# Patient Record
Sex: Female | Born: 1945
Health system: Southern US, Community
[De-identification: ages and names within clinical notes are randomized; demographics above are authoritative.]

## PROBLEM LIST (undated history)

## (undated) DIAGNOSIS — Z923 Personal history of irradiation: Secondary | ICD-10-CM

## (undated) DIAGNOSIS — E119 Type 2 diabetes mellitus without complications: Secondary | ICD-10-CM

## (undated) DIAGNOSIS — M549 Dorsalgia, unspecified: Secondary | ICD-10-CM

## (undated) DIAGNOSIS — C801 Malignant (primary) neoplasm, unspecified: Secondary | ICD-10-CM

## (undated) DIAGNOSIS — IMO0002 Reserved for concepts with insufficient information to code with codable children: Secondary | ICD-10-CM

## (undated) DIAGNOSIS — D649 Anemia, unspecified: Secondary | ICD-10-CM

## (undated) DIAGNOSIS — H269 Unspecified cataract: Secondary | ICD-10-CM

## (undated) DIAGNOSIS — T8859XA Other complications of anesthesia, initial encounter: Secondary | ICD-10-CM

## (undated) DIAGNOSIS — H409 Unspecified glaucoma: Secondary | ICD-10-CM

## (undated) DIAGNOSIS — G629 Polyneuropathy, unspecified: Secondary | ICD-10-CM

## (undated) DIAGNOSIS — Z9221 Personal history of antineoplastic chemotherapy: Secondary | ICD-10-CM

## (undated) DIAGNOSIS — M1990 Unspecified osteoarthritis, unspecified site: Secondary | ICD-10-CM

## (undated) DIAGNOSIS — I10 Essential (primary) hypertension: Secondary | ICD-10-CM

## (undated) DIAGNOSIS — H34213 Partial retinal artery occlusion, bilateral: Secondary | ICD-10-CM

## (undated) DIAGNOSIS — T4145XA Adverse effect of unspecified anesthetic, initial encounter: Secondary | ICD-10-CM

## (undated) HISTORY — PX: BREAST LUMPECTOMY: SHX2

## (undated) HISTORY — PX: EYE SURGERY: SHX253

## (undated) HISTORY — DX: Malignant (primary) neoplasm, unspecified: C80.1

## (undated) HISTORY — PX: BREAST CYST ASPIRATION: SHX578

## (undated) HISTORY — PX: COLONOSCOPY: SHX174

## (undated) HISTORY — DX: Unspecified osteoarthritis, unspecified site: M19.90

## (undated) HISTORY — PX: BREAST EXCISIONAL BIOPSY: SUR124

## (undated) HISTORY — DX: Polyneuropathy, unspecified: G62.9

## (undated) HISTORY — DX: Reserved for concepts with insufficient information to code with codable children: IMO0002

## (undated) HISTORY — DX: Type 2 diabetes mellitus without complications: E11.9

## (undated) HISTORY — PX: DILATION AND CURETTAGE OF UTERUS: SHX78

## (undated) HISTORY — PX: TOE SURGERY: SHX1073

## (undated) HISTORY — PX: BREAST BIOPSY: SHX20

---

## 2004-03-06 ENCOUNTER — Ambulatory Visit: Payer: Self-pay

## 2004-03-16 ENCOUNTER — Ambulatory Visit: Payer: Self-pay

## 2004-03-30 ENCOUNTER — Ambulatory Visit: Payer: Self-pay | Admitting: Unknown Physician Specialty

## 2007-02-18 ENCOUNTER — Ambulatory Visit: Payer: Self-pay | Admitting: Family Medicine

## 2008-04-02 DIAGNOSIS — G629 Polyneuropathy, unspecified: Secondary | ICD-10-CM

## 2008-04-02 DIAGNOSIS — C50919 Malignant neoplasm of unspecified site of unspecified female breast: Secondary | ICD-10-CM

## 2008-04-02 HISTORY — PX: BREAST SURGERY: SHX581

## 2008-04-02 HISTORY — DX: Polyneuropathy, unspecified: G62.9

## 2008-04-02 HISTORY — DX: Malignant neoplasm of unspecified site of unspecified female breast: C50.919

## 2008-04-02 HISTORY — PX: SENTINEL LYMPH NODE BIOPSY: SHX2392

## 2008-04-27 ENCOUNTER — Ambulatory Visit: Payer: Self-pay

## 2008-05-14 ENCOUNTER — Ambulatory Visit: Payer: Self-pay | Admitting: General Surgery

## 2008-05-21 ENCOUNTER — Ambulatory Visit: Payer: Self-pay | Admitting: Cardiology

## 2008-05-21 ENCOUNTER — Ambulatory Visit: Payer: Self-pay | Admitting: General Surgery

## 2008-05-26 ENCOUNTER — Ambulatory Visit: Payer: Self-pay | Admitting: General Surgery

## 2008-05-26 DIAGNOSIS — C50919 Malignant neoplasm of unspecified site of unspecified female breast: Secondary | ICD-10-CM | POA: Insufficient documentation

## 2008-05-26 DIAGNOSIS — C801 Malignant (primary) neoplasm, unspecified: Secondary | ICD-10-CM

## 2008-05-26 HISTORY — DX: Malignant (primary) neoplasm, unspecified: C80.1

## 2008-05-31 ENCOUNTER — Ambulatory Visit: Payer: Self-pay | Admitting: Internal Medicine

## 2008-06-04 ENCOUNTER — Ambulatory Visit: Payer: Self-pay | Admitting: Internal Medicine

## 2008-06-07 ENCOUNTER — Ambulatory Visit: Payer: Self-pay | Admitting: General Surgery

## 2008-07-01 ENCOUNTER — Ambulatory Visit: Payer: Self-pay | Admitting: Internal Medicine

## 2008-07-31 ENCOUNTER — Ambulatory Visit: Payer: Self-pay | Admitting: Internal Medicine

## 2008-08-31 ENCOUNTER — Ambulatory Visit: Payer: Self-pay | Admitting: Internal Medicine

## 2008-09-30 ENCOUNTER — Ambulatory Visit: Payer: Self-pay | Admitting: Internal Medicine

## 2008-10-31 ENCOUNTER — Ambulatory Visit: Payer: Self-pay | Admitting: Internal Medicine

## 2008-11-30 ENCOUNTER — Ambulatory Visit: Payer: Self-pay | Admitting: General Surgery

## 2008-12-01 ENCOUNTER — Ambulatory Visit: Payer: Self-pay | Admitting: Internal Medicine

## 2008-12-31 ENCOUNTER — Ambulatory Visit: Payer: Self-pay | Admitting: Internal Medicine

## 2009-01-31 ENCOUNTER — Ambulatory Visit: Payer: Self-pay | Admitting: Internal Medicine

## 2009-03-02 ENCOUNTER — Ambulatory Visit: Payer: Self-pay | Admitting: Family Medicine

## 2009-03-02 ENCOUNTER — Ambulatory Visit: Payer: Self-pay | Admitting: Internal Medicine

## 2009-03-03 ENCOUNTER — Ambulatory Visit: Payer: Self-pay | Admitting: Internal Medicine

## 2009-04-02 ENCOUNTER — Ambulatory Visit: Payer: Self-pay | Admitting: Internal Medicine

## 2009-05-03 ENCOUNTER — Ambulatory Visit: Payer: Self-pay | Admitting: Internal Medicine

## 2009-06-01 ENCOUNTER — Ambulatory Visit: Payer: Self-pay

## 2009-06-02 ENCOUNTER — Ambulatory Visit: Payer: Self-pay | Admitting: Internal Medicine

## 2009-07-01 ENCOUNTER — Ambulatory Visit: Payer: Self-pay | Admitting: Internal Medicine

## 2009-07-31 ENCOUNTER — Ambulatory Visit: Payer: Self-pay | Admitting: Internal Medicine

## 2009-08-31 ENCOUNTER — Ambulatory Visit: Payer: Self-pay | Admitting: Internal Medicine

## 2009-09-20 ENCOUNTER — Ambulatory Visit: Payer: Self-pay | Admitting: Internal Medicine

## 2009-09-30 ENCOUNTER — Ambulatory Visit: Payer: Self-pay | Admitting: Internal Medicine

## 2009-11-03 ENCOUNTER — Ambulatory Visit: Payer: Self-pay | Admitting: Internal Medicine

## 2009-12-01 ENCOUNTER — Ambulatory Visit: Payer: Self-pay | Admitting: Internal Medicine

## 2009-12-08 ENCOUNTER — Ambulatory Visit: Payer: Self-pay | Admitting: General Surgery

## 2009-12-13 LAB — CANCER ANTIGEN 27.29: CA 27.29: 7.8 U/mL (ref 0.0–38.6)

## 2009-12-31 ENCOUNTER — Ambulatory Visit: Payer: Self-pay | Admitting: Internal Medicine

## 2010-02-02 ENCOUNTER — Ambulatory Visit: Payer: Self-pay | Admitting: Internal Medicine

## 2010-03-02 ENCOUNTER — Ambulatory Visit: Payer: Self-pay | Admitting: Internal Medicine

## 2010-03-30 ENCOUNTER — Ambulatory Visit: Payer: Self-pay | Admitting: Internal Medicine

## 2010-03-31 LAB — CANCER ANTIGEN 27.29: CA 27.29: 11.1 U/mL (ref 0.0–38.6)

## 2010-04-02 ENCOUNTER — Ambulatory Visit: Payer: Self-pay | Admitting: Internal Medicine

## 2010-05-25 ENCOUNTER — Emergency Department: Payer: Self-pay | Admitting: Emergency Medicine

## 2010-06-06 ENCOUNTER — Ambulatory Visit: Payer: Self-pay

## 2010-08-04 ENCOUNTER — Ambulatory Visit: Payer: Self-pay | Admitting: Internal Medicine

## 2010-09-01 ENCOUNTER — Ambulatory Visit: Payer: Self-pay | Admitting: Internal Medicine

## 2010-09-01 LAB — CANCER ANTIGEN 27.29: CA 27.29: 11.6 U/mL (ref 0.0–38.6)

## 2010-12-19 ENCOUNTER — Ambulatory Visit: Payer: Self-pay

## 2011-03-01 ENCOUNTER — Ambulatory Visit: Payer: Self-pay | Admitting: Internal Medicine

## 2011-03-02 LAB — CANCER ANTIGEN 27.29: CA 27.29: 7.5 U/mL (ref 0.0–38.6)

## 2011-03-03 ENCOUNTER — Ambulatory Visit: Payer: Self-pay | Admitting: Internal Medicine

## 2011-06-19 ENCOUNTER — Ambulatory Visit: Payer: Self-pay

## 2011-10-01 ENCOUNTER — Ambulatory Visit: Payer: Self-pay | Admitting: Internal Medicine

## 2011-10-01 LAB — CBC CANCER CENTER
Basophil %: 0.6 %
Eosinophil %: 3 %
HCT: 36.9 % (ref 35.0–47.0)
Lymphocyte #: 3.2 x10 3/mm (ref 1.0–3.6)
MCH: 26.8 pg (ref 26.0–34.0)
MCHC: 31.1 g/dL — ABNORMAL LOW (ref 32.0–36.0)
MCV: 86 fL (ref 80–100)
Monocyte #: 0.8 x10 3/mm (ref 0.2–0.9)
Monocyte %: 8.2 %
Neutrophil #: 5.7 x10 3/mm (ref 1.4–6.5)
Neutrophil %: 56.8 %
RBC: 4.28 10*6/uL (ref 3.80–5.20)
WBC: 10.1 x10 3/mm (ref 3.6–11.0)

## 2011-10-01 LAB — COMPREHENSIVE METABOLIC PANEL
Anion Gap: 9 (ref 7–16)
Bilirubin,Total: 0.2 mg/dL (ref 0.2–1.0)
Chloride: 104 mmol/L (ref 98–107)
Co2: 27 mmol/L (ref 21–32)
EGFR (African American): >60
EGFR (Non-African Amer.): >60
SGOT(AST): 16 U/L (ref 15–37)
SGPT (ALT): 24 U/L

## 2011-10-01 LAB — IRON AND TIBC
Iron Bind.Cap.(Total): 354 ug/dL (ref 250–450)
Iron: 48 ug/dL — ABNORMAL LOW (ref 50–170)
Unbound Iron-Bind.Cap.: 306 ug/dL

## 2011-10-01 LAB — RETICULOCYTES: Absolute Retic Count: 0.0468 10*6/uL (ref 0.024–0.084)

## 2011-10-01 LAB — FERRITIN: Ferritin (ARMC): 39 ng/mL (ref 8–388)

## 2011-10-02 LAB — CANCER ANTIGEN 27.29: CA 27.29: 11.8 U/mL (ref 0.0–38.6)

## 2011-11-01 ENCOUNTER — Ambulatory Visit: Payer: Self-pay | Admitting: Internal Medicine

## 2011-12-24 ENCOUNTER — Ambulatory Visit: Payer: Self-pay | Admitting: Internal Medicine

## 2012-01-08 ENCOUNTER — Ambulatory Visit: Payer: Self-pay | Admitting: Family Medicine

## 2012-02-01 ENCOUNTER — Ambulatory Visit: Payer: Self-pay | Admitting: Family Medicine

## 2012-03-02 ENCOUNTER — Ambulatory Visit: Payer: Self-pay | Admitting: Family Medicine

## 2012-03-04 ENCOUNTER — Ambulatory Visit: Payer: Self-pay | Admitting: Family Medicine

## 2012-04-02 ENCOUNTER — Ambulatory Visit: Payer: Self-pay | Admitting: Family Medicine

## 2012-04-09 DIAGNOSIS — H269 Unspecified cataract: Secondary | ICD-10-CM | POA: Insufficient documentation

## 2012-04-15 DIAGNOSIS — H401190 Primary open-angle glaucoma, unspecified eye, stage unspecified: Secondary | ICD-10-CM | POA: Insufficient documentation

## 2012-05-31 ENCOUNTER — Encounter: Payer: Self-pay | Admitting: *Deleted

## 2012-06-19 ENCOUNTER — Ambulatory Visit: Payer: Self-pay | Admitting: General Surgery

## 2012-06-20 DIAGNOSIS — M549 Dorsalgia, unspecified: Secondary | ICD-10-CM | POA: Insufficient documentation

## 2012-07-03 ENCOUNTER — Ambulatory Visit: Payer: Self-pay | Admitting: General Surgery

## 2012-07-09 ENCOUNTER — Ambulatory Visit (INDEPENDENT_AMBULATORY_CARE_PROVIDER_SITE_OTHER): Payer: Medicare Other | Admitting: General Surgery

## 2012-07-09 ENCOUNTER — Encounter: Payer: Self-pay | Admitting: General Surgery

## 2012-07-09 VITALS — BP 118/62 | HR 76 | Resp 14 | Ht 62.0 in | Wt 256.0 lb

## 2012-07-09 DIAGNOSIS — C50919 Malignant neoplasm of unspecified site of unspecified female breast: Secondary | ICD-10-CM

## 2012-07-09 DIAGNOSIS — C50912 Malignant neoplasm of unspecified site of left female breast: Secondary | ICD-10-CM

## 2012-07-09 NOTE — Patient Instructions (Addendum)
The patient is aware to call back for any questions or concerns. Continue self breast exams.  The patient has been asked to return to the office in one year for a bilateral diagnostic mammogram.

## 2012-07-09 NOTE — Progress Notes (Signed)
Patient ID: Selena Contreras, female   DOB: 09/21/1945, 67 y.o.   MRN: [id number]  Chief Complaint  Patient presents with  . Breast Cancer Long Term Follow Up    HPI Selena Contreras is a 67 y.o. female.  Patient here today for follow up mammogram .  Patient with known history of left breast wide excision for breast cancer 05-26-08, completed chemotherapy and radiation  In 2010. Patient does state she has occasional left breast "soreness" but otherwise doing "good". Followed by Dr Sherrlyn Hock and Dr Rushie Chestnut at [facility]. HPI  Past Medical History  Diagnosis Date  . Cancer 40981     wide LEFT excision, mastoplasty and sentinel node biopsy in February 2010  . Ulcer     STOMACH  . Arthritis   . Diabetes mellitus without complication     AGE 77  . Neuropathy 2010    Past Surgical History  Procedure Laterality Date  . Breast surgery Left 2010    LEFT BREAST WIDE EXCISION  . Sentinel lymph node biopsy Left 2010  . Toe surgery    . Colonoscopy  2012    [place]    Family History  Problem Relation Age of Onset  . Breast cancer Sister   . Colon cancer      Social History History  Substance Use Topics  . Smoking status: Never Smoker   . Smokeless tobacco: Never Used  . Alcohol Use: No    Allergies  Allergen Reactions  . Flagyl (Metronidazole)   . Lisinopril Cough  . Naprosyn (Naproxen)     UNKNOWN   . Tegaderm Ag Mesh (Silver)     UNKNOWN   . Tape Rash    PAPER OK TO USE     Current Outpatient Prescriptions  Medication Sig Dispense Refill  . aspirin 81 MG tablet Take 81 mg by mouth daily.      . bimatoprost (LUMIGAN) 0.01 % SOLN 1 drop at bedtime.      . calcium carbonate (OS-CAL) 600 MG TABS Take 600 mg by mouth 2 (two) times daily with a meal.      . candesartan (ATACAND) 8 MG tablet Take 8 mg by mouth daily.      . Ferrous Sulfate (IRON) 325 (65 FE) MG TABS Take 1 tablet by mouth daily.      . fish oil-omega-3 fatty acids 1000 MG capsule Take 2 g by  mouth daily.      Marland Kitchen gabapentin (NEURONTIN) 300 MG capsule       . glipiZIDE (GLUCOTROL) 10 MG tablet Take 10 mg by mouth 2 (two) times daily before a meal.      . metFORMIN (GLUCOPHAGE) 1000 MG tablet Take 1,000 mg by mouth 2 (two) times daily with a meal.      . sitaGLIPtin (JANUVIA) 100 MG tablet Take 100 mg by mouth daily.      . timolol (BETIMOL) 0.25 % ophthalmic solution 1-2 drops 2 (two) times daily.       No current facility-administered medications for this visit.    Review of Systems Review of Systems  Constitutional: Negative.   Respiratory: Negative.   Cardiovascular: Negative.     Blood pressure 118/62, pulse 76, resp. rate 14, height 5\' 2"  (1.575 m), weight 256 lb (116.121 kg).  Physical Exam Physical Exam  Constitutional: She is oriented to person, place, and time. She appears well-developed and well-nourished.  Cardiovascular: Normal rate and regular rhythm.   Pulmonary/Chest: Effort normal and breath sounds  normal. Right breast exhibits no inverted nipple, no mass, no nipple discharge, no skin change and no tenderness. Left breast exhibits no inverted nipple, no mass, no nipple discharge, no skin change and no tenderness.  Lymphadenopathy:    She has no cervical adenopathy.    She has no axillary adenopathy.  Neurological: She is alert and oriented to person, place, and time.  Skin: Skin is warm and dry.  Left breast well healed incision at 3 oclock  Data Reviewed Bilateral mammograms dated 06/19/2012 showed the right breast to be unremarkable. Stable postsurgical changes left breast. BI-RADS-2.  Assessment    Doing well now 4 years post management of her triple negative breast cancer.    Plan    Followup examination with bilateral mammograms in 1 year per       Earline Mayotte 07/10/2012, 8:38 PM

## 2012-07-10 ENCOUNTER — Encounter: Payer: Self-pay | Admitting: General Surgery

## 2012-09-30 ENCOUNTER — Ambulatory Visit: Payer: Self-pay | Admitting: Internal Medicine

## 2012-10-01 LAB — CREATININE, SERUM
Creatinine: 0.99 mg/dL (ref 0.60–1.30)
EGFR (African American): >60
EGFR (Non-African Amer.): 60 — ABNORMAL LOW

## 2012-10-01 LAB — HEPATIC FUNCTION PANEL A (ARMC)
Albumin: 3.3 g/dL — ABNORMAL LOW (ref 3.4–5.0)
SGPT (ALT): 24 U/L (ref 12–78)

## 2012-10-01 LAB — IRON AND TIBC
Iron Bind.Cap.(Total): 349 ug/dL (ref 250–450)
Iron Saturation: 27 %
Iron: 93 ug/dL (ref 50–170)
Unbound Iron-Bind.Cap.: 256 ug/dL

## 2012-10-01 LAB — CBC CANCER CENTER
Basophil #: 0.1 x10 3/mm (ref 0.0–0.1)
Basophil %: 1 %
Eosinophil #: 0.4 x10 3/mm (ref 0.0–0.7)
Eosinophil %: 5.6 %
HCT: 35.8 % (ref 35.0–47.0)
HGB: 11.7 g/dL — ABNORMAL LOW (ref 12.0–16.0)
Lymphocyte %: 30 %
Lymphs Abs: 2.3 x10 3/mm (ref 1.0–3.6)
MCH: 27.6 pg (ref 26.0–34.0)
MCHC: 32.7 g/dL (ref 32.0–36.0)
MCV: 84 fL (ref 80–100)
Monocyte #: 0.7 x10 3/mm (ref 0.2–0.9)
Monocyte %: 9.4 %
Neutrophil #: 4.1 x10 3/mm (ref 1.4–6.5)
Neutrophil %: 54 %
Platelet: 235 x10 3/mm (ref 150–440)
RBC: 4.24 x10 6/mm (ref 3.80–5.20)
RDW: 14.2 % (ref 11.5–14.5)
WBC: 7.6 x10 3/mm (ref 3.6–11.0)

## 2012-10-02 LAB — CANCER ANTIGEN 27.29: CA 27.29: 9.3 U/mL (ref 0.0–38.6)

## 2012-10-31 ENCOUNTER — Ambulatory Visit: Payer: Self-pay | Admitting: Internal Medicine

## 2013-06-23 ENCOUNTER — Ambulatory Visit: Payer: Self-pay | Admitting: General Surgery

## 2013-06-24 ENCOUNTER — Encounter: Payer: Self-pay | Admitting: General Surgery

## 2013-07-06 ENCOUNTER — Ambulatory Visit: Payer: Medicare Other | Admitting: General Surgery

## 2013-07-20 ENCOUNTER — Ambulatory Visit (INDEPENDENT_AMBULATORY_CARE_PROVIDER_SITE_OTHER): Payer: Medicare HMO | Admitting: General Surgery

## 2013-07-20 ENCOUNTER — Encounter: Payer: Self-pay | Admitting: General Surgery

## 2013-07-20 VITALS — BP 130/72 | HR 76 | Resp 14 | Ht 62.0 in | Wt 254.0 lb

## 2013-07-20 DIAGNOSIS — C50912 Malignant neoplasm of unspecified site of left female breast: Secondary | ICD-10-CM

## 2013-07-20 DIAGNOSIS — C50919 Malignant neoplasm of unspecified site of unspecified female breast: Secondary | ICD-10-CM

## 2013-07-20 NOTE — Patient Instructions (Signed)
Patient to return in 1 year with a bilateral diagnostic mammogram. Continue self breast exams. Call office for any new breast issues or concerns.  

## 2013-07-20 NOTE — Progress Notes (Signed)
Patient ID: Selena Contreras, female   DOB: 09/07/1945, 68 y.o.   MRN: [id number]  Chief Complaint  Patient presents with  . Follow-up    mammogram    HPI Selena Contreras is a 68 y.o. female who presents for a breast evaluation. The most recent mammogram was done on 06/24/13. Patient does perform regular self breast checks and gets regular mammograms done. The patient denies any new problems with the breasts at this time.   HPI  Past Medical History  Diagnosis Date  . Cancer 20140     wide LEFT excision, mastoplasty and sentinel node biopsy in February 2010  . Ulcer     STOMACH  . Arthritis   . Diabetes mellitus without complication     AGE 34  . Neuropathy 2010    Past Surgical History  Procedure Laterality Date  . Breast surgery Left 2010    LEFT BREAST WIDE EXCISION  . Sentinel lymph node biopsy Left 2010  . Toe surgery    . Colonoscopy  2012        Family History  Problem Relation Age of Onset  . Breast cancer Sister   . Colon cancer      Social History History  Substance Use Topics  . Smoking status: Never Smoker   . Smokeless tobacco: Never Used  . Alcohol Use: No    Allergies  Allergen Reactions  . Flagyl [Metronidazole]   . Lisinopril Cough  . Naprosyn [Naproxen]     UNKNOWN   . Tegaderm Ag Mesh [Silver]     UNKNOWN   . Tape Rash    PAPER OK TO USE     Current Outpatient Prescriptions  Medication Sig Dispense Refill  . aspirin 81 MG tablet Take 81 mg by mouth daily.      . bimatoprost (LUMIGAN) 0.01 % SOLN 1 drop at bedtime.      . calcium carbonate (OS-CAL) 600 MG TABS Take 600 mg by mouth 2 (two) times daily with a meal.      . candesartan (ATACAND) 8 MG tablet Take 8 mg by mouth daily.      . Ferrous Sulfate (IRON) 325 (65 FE) MG TABS Take 1 tablet by mouth daily.      . fish oil-omega-3 fatty acids 1000 MG capsule Take 2 g by mouth daily.      Marland Kitchen gabapentin (NEURONTIN) 300 MG capsule       . glipiZIDE (GLUCOTROL) 10 MG  tablet Take 10 mg by mouth 2 (two) times daily before a meal.      . metFORMIN (GLUCOPHAGE) 1000 MG tablet Take 1,000 mg by mouth 2 (two) times daily with a meal.      . sitaGLIPtin (JANUVIA) 100 MG tablet Take 100 mg by mouth daily.      . timolol (BETIMOL) 0.25 % ophthalmic solution 1-2 drops 2 (two) times daily.       No current facility-administered medications for this visit.    Review of Systems Review of Systems  Constitutional: Negative.   Respiratory: Negative.   Cardiovascular: Negative.     Blood pressure 130/72, pulse 76, resp. rate 14, height 5\' 2"  (1.575 m), weight 254 lb (115.214 kg).  Physical Exam Physical Exam  Constitutional: She is oriented to person, place, and time. She appears well-developed and well-nourished.  Neck: Neck supple. No thyromegaly present.  Cardiovascular: Normal rate, regular rhythm and normal heart sounds.   No murmur heard. Pulmonary/Chest: Effort normal and breath sounds normal.  Right breast exhibits no inverted nipple, no mass, no nipple discharge, no skin change and no tenderness. Left breast exhibits no inverted nipple, no mass, no nipple discharge, no skin change and no tenderness. Breast asymmetry: left breast is one cup size smaller than the right.    Right breast well healed scar from port site.   Left breast well healed incision at 3 o'clock as well as well healed scar in the axilla.   Lymphadenopathy:    She has no cervical adenopathy.    She has no axillary adenopathy.  Neurological: She is alert and oriented to person, place, and time.  Skin: Skin is warm and dry.    Data Reviewed Bilateral mammogram dated June 23, 2013 was unremarkable. BI-RAD-2.  Assessment    Benign breast exam.  6 pound weight gain since 2013.     Plan    Follow up examination in one year as planned. Bilateral mammograms will be obtained at that time.      PCP: Beverely Pace Byrnett 07/20/2013, 9:51 PM

## 2013-08-12 ENCOUNTER — Encounter: Payer: Self-pay | Admitting: General Surgery

## 2014-01-06 ENCOUNTER — Ambulatory Visit: Payer: Self-pay | Admitting: Internal Medicine

## 2014-01-06 LAB — IRON AND TIBC
Iron Bind.Cap.(Total): 328 ug/dL (ref 250–450)
Iron Saturation: 21 %
Iron: 69 ug/dL (ref 50–170)
Unbound Iron-Bind.Cap.: 259 ug/dL

## 2014-01-06 LAB — CBC CANCER CENTER
EOS PCT: 19 %
HCT: 34.6 % — ABNORMAL LOW (ref 35.0–47.0)
HGB: 10.9 g/dL — AB (ref 12.0–16.0)
Lymphocytes: 30 %
MCH: 27.4 pg (ref 26.0–34.0)
MCHC: 31.6 g/dL — AB (ref 32.0–36.0)
MCV: 87 fL (ref 80–100)
MONOS PCT: 8 %
Platelet: 257 x10 3/mm (ref 150–440)
RBC: 3.99 10*6/uL (ref 3.80–5.20)
RDW: 14.9 % — AB (ref 11.5–14.5)
SEGMENTED NEUTROPHILS: 39 %
Variant Lymphocyte: 4 %
WBC: 9.5 x10 3/mm (ref 3.6–11.0)

## 2014-01-06 LAB — RETICULOCYTES
Absolute Retic Count: 0.039 10*6/uL (ref 0.019–0.186)
RETICULOCYTE: 0.98 % (ref 0.4–3.1)

## 2014-01-06 LAB — HEPATIC FUNCTION PANEL A (ARMC)
ALT: 24 U/L
AST: 18 U/L (ref 15–37)
Albumin: 3.2 g/dL — ABNORMAL LOW (ref 3.4–5.0)
Alkaline Phosphatase: 84 U/L
BILIRUBIN TOTAL: 0.3 mg/dL (ref 0.2–1.0)
Bilirubin, Direct: 0.1 mg/dL (ref 0.00–0.20)
Total Protein: 7.1 g/dL (ref 6.4–8.2)

## 2014-01-06 LAB — CREATININE, SERUM
CREATININE: 0.89 mg/dL (ref 0.60–1.30)
EGFR (Non-African Amer.): >60

## 2014-01-06 LAB — FERRITIN: FERRITIN (ARMC): 76 ng/mL (ref 8–388)

## 2014-01-06 LAB — FOLATE: Folic Acid: 32.1 ng/mL (ref 3.1–100.0)

## 2014-01-07 LAB — CANCER ANTIGEN 27.29: CA 27.29: 10.4 U/mL (ref 0.0–38.6)

## 2014-01-31 ENCOUNTER — Ambulatory Visit: Payer: Self-pay | Admitting: Internal Medicine

## 2014-02-01 ENCOUNTER — Encounter: Payer: Self-pay | Admitting: General Surgery

## 2014-04-13 DIAGNOSIS — G6289 Other specified polyneuropathies: Secondary | ICD-10-CM | POA: Diagnosis not present

## 2014-04-13 DIAGNOSIS — E669 Obesity, unspecified: Secondary | ICD-10-CM | POA: Diagnosis not present

## 2014-04-13 DIAGNOSIS — E119 Type 2 diabetes mellitus without complications: Secondary | ICD-10-CM | POA: Diagnosis not present

## 2014-04-13 DIAGNOSIS — Z7189 Other specified counseling: Secondary | ICD-10-CM | POA: Diagnosis not present

## 2014-04-29 DIAGNOSIS — D721 Eosinophilia: Secondary | ICD-10-CM | POA: Diagnosis not present

## 2014-04-29 DIAGNOSIS — D72828 Other elevated white blood cell count: Secondary | ICD-10-CM | POA: Diagnosis not present

## 2014-05-07 ENCOUNTER — Encounter: Payer: Self-pay | Admitting: Podiatry

## 2014-05-07 ENCOUNTER — Ambulatory Visit (INDEPENDENT_AMBULATORY_CARE_PROVIDER_SITE_OTHER): Payer: Commercial Managed Care - HMO | Admitting: Podiatry

## 2014-05-07 ENCOUNTER — Ambulatory Visit (INDEPENDENT_AMBULATORY_CARE_PROVIDER_SITE_OTHER): Payer: Commercial Managed Care - HMO

## 2014-05-07 VITALS — BP 102/58 | HR 77 | Resp 16

## 2014-05-07 DIAGNOSIS — M779 Enthesopathy, unspecified: Secondary | ICD-10-CM

## 2014-05-07 MED ORDER — TRIAMCINOLONE ACETONIDE 10 MG/ML IJ SUSP
10.0000 mg | Freq: Once | INTRAMUSCULAR | Status: AC
  Administered 2014-05-07: 10 mg

## 2014-05-07 NOTE — Progress Notes (Signed)
   Subjective:    Patient ID: Selena Contreras, female    DOB: [birth year], 69 y.o.   MRN: [id number]  HPI Comments: "I have some pain in the foot and ankle"  Patient c/o aching, popping lateral ankle and foot for several months. She has some swelling as well as flat feet. She states that she has neuropathy symptoms. No home treatment.     Review of Systems  HENT: Positive for hearing loss and tinnitus.   Hematological: Bruises/bleeds easily.  All other systems reviewed and are negative.      Objective:   Physical Exam        Assessment & Plan:

## 2014-05-09 NOTE — Progress Notes (Signed)
Subjective:     Patient ID: Selena Contreras, female   DOB: 1945/11/07, 70 y.o.   MRN: [id number]  HPI patient states I been having a lot of pain in my right foot and ankle for the last several months. Do not remember specific injury but I do know I have flatfeet   Review of Systems  All other systems reviewed and are negative.      Objective:   Physical Exam  Constitutional: She is oriented to person, place, and time.  Cardiovascular: Intact distal pulses.   Musculoskeletal: Normal range of motion.  Neurological: She is oriented to person, place, and time.  Skin: Skin is warm.  Nursing note and vitals reviewed.  neurovascular status intact with muscle strength adequate range of motion within normal limits. Patient's noted to have significant depression of the arch bilateral with quite a bit of discomfort in the sinus tarsi right with inflammation and fluid upon palpation. Patient is noted to have mild equinus condition and digits that are well-perfused and she is well oriented 3     Assessment:     Acute sinus tarsitis right with symptoms secondary to structural flatfoot deformity    Plan:     H&P and x-ray reviewed and today I injected the sinus tarsi right 3 mg Kenalog 5 mg Xylocaine and applied fascial brace to lift the medial arch and stabilize the ankle. Patient will be seen back to recheck in 3 weeks

## 2014-05-18 DIAGNOSIS — H4020X2 Unspecified primary angle-closure glaucoma, moderate stage: Secondary | ICD-10-CM | POA: Diagnosis not present

## 2014-05-21 DIAGNOSIS — D721 Eosinophilia: Secondary | ICD-10-CM | POA: Diagnosis not present

## 2014-05-21 DIAGNOSIS — H34213 Partial retinal artery occlusion, bilateral: Secondary | ICD-10-CM | POA: Insufficient documentation

## 2014-05-21 DIAGNOSIS — E119 Type 2 diabetes mellitus without complications: Secondary | ICD-10-CM | POA: Diagnosis not present

## 2014-05-21 DIAGNOSIS — D72828 Other elevated white blood cell count: Secondary | ICD-10-CM | POA: Diagnosis not present

## 2014-05-31 ENCOUNTER — Ambulatory Visit (INDEPENDENT_AMBULATORY_CARE_PROVIDER_SITE_OTHER): Payer: Medicare HMO | Admitting: General Surgery

## 2014-05-31 ENCOUNTER — Encounter: Payer: Self-pay | Admitting: General Surgery

## 2014-05-31 VITALS — BP 110/62 | HR 88 | Resp 16 | Ht 62.0 in | Wt 249.0 lb

## 2014-05-31 DIAGNOSIS — C50912 Malignant neoplasm of unspecified site of left female breast: Secondary | ICD-10-CM

## 2014-05-31 NOTE — Patient Instructions (Addendum)
Continue self breast exams. Call office for any new breast issues or concerns. Call after your mammogram for your results.

## 2014-05-31 NOTE — Progress Notes (Signed)
Patient ID: Selena Contreras, female   DOB: 11/13/1945, 69 y.o.   MRN: [id number]  Chief Complaint  Patient presents with  . Other    left breast problems    HPI Selena Contreras is a 69 y.o. female  With a history of left breast cancer here due to finding a pea sized area on her left inner breast about a week and a half ago. She states the area was sore and lumpy feeling and the area was mobile. She is now unsure if she is still feeling the mass. She states that the pain has also gone away. She reports no other breast problems. She is scheduled for her yearly mammogram on 06/25/14.  HPI  Past Medical History  Diagnosis Date  . Ulcer     STOMACH  . Arthritis   . Diabetes mellitus without complication     AGE 60  . Neuropathy 2010  . Cancer February 24,2010    Left breast:  T1 C., N0, M0, triple negative treated with wide excision, mastoplasty and sentinel node biopsy in February 2010    Past Surgical History  Procedure Laterality Date  . Breast surgery Left 2010    LEFT BREAST WIDE EXCISION  . Sentinel lymph node biopsy Left 2010  . Toe surgery    . Colonoscopy  2012    [place]    Family History  Problem Relation Age of Onset  . Breast cancer Sister   . Colon cancer      Social History History  Substance Use Topics  . Smoking status: Never Smoker   . Smokeless tobacco: Never Used  . Alcohol Use: No    Allergies  Allergen Reactions  . Flagyl [Metronidazole]   . Lisinopril Cough  . Naprosyn [Naproxen]     UNKNOWN   . Tegaderm Ag Mesh [Silver]     UNKNOWN   . Doxycycline Other (See Comments)    fatigue per pt  . Tape Rash    PAPER OK TO USE     Current Outpatient Prescriptions  Medication Sig Dispense Refill  . acetaminophen (TYLENOL) 500 MG tablet Take 500 mg by mouth every 6 (six) hours as needed.    Marland Kitchen aspirin 81 MG tablet Take 81 mg by mouth daily.    . bimatoprost (LUMIGAN) 0.01 % SOLN 1 drop at bedtime.    . calcium carbonate (OS-CAL) 600  MG TABS Take 600 mg by mouth 2 (two) times daily with a meal.    . Ferrous Sulfate (IRON) 325 (65 FE) MG TABS Take 1 tablet by mouth daily.    Marland Kitchen gabapentin (NEURONTIN) 300 MG capsule     . glipiZIDE (GLUCOTROL) 10 MG tablet Take 10 mg by mouth 2 (two) times daily before a meal.    . losartan (COZAAR) 25 MG tablet Take 25 mg by mouth daily.    . metFORMIN (GLUCOPHAGE) 1000 MG tablet Take 1,000 mg by mouth 2 (two) times daily with a meal.    . Multiple Vitamin (MULTIVITAMIN) capsule Take 1 capsule by mouth daily.    . sitaGLIPtin (JANUVIA) 100 MG tablet Take 100 mg by mouth daily.    . timolol (BETIMOL) 0.25 % ophthalmic solution 1-2 drops 2 (two) times daily.    . fish oil-omega-3 fatty acids 1000 MG capsule Take 2 g by mouth daily.     No current facility-administered medications for this visit.    Review of Systems Review of Systems  Constitutional: Negative.   Respiratory: Negative.  Cardiovascular: Negative.     Blood pressure 110/62, pulse 88, resp. rate 16, height 5\' 2"  (1.575 m), weight 249 lb (112.946 kg).  Physical Exam Physical Exam  Constitutional: She is oriented to person, place, and time. She appears well-developed and well-nourished.  Neck: Neck supple.  Cardiovascular: Normal rate, regular rhythm and normal heart sounds.   Pulmonary/Chest: Effort normal and breath sounds normal. Right breast exhibits no inverted nipple, no mass, no nipple discharge, no skin change and no tenderness. Left breast exhibits no inverted nipple, no mass, no nipple discharge, no skin change and no tenderness.    Well healed incision at 3 o'clock.   Lymphadenopathy:    She has no cervical adenopathy.    She has no axillary adenopathy.  Neurological: She is alert and oriented to person, place, and time.     Assessment    Benign breast exam.    Plan    The patient will follow-up for her routine mammogram later this month. She was asked to call the day of the exams of the films can  be independently reviewed. If no abnormalities appreciated, and based on today's unremarkable exam, it will not be necessary for her to return for a post mammogram examination. She will be put in one year follow-up.    PCP: Paulita Cradle   Robert Bellow 06/01/2014, 8:56 AM

## 2014-06-02 DIAGNOSIS — H34213 Partial retinal artery occlusion, bilateral: Secondary | ICD-10-CM | POA: Diagnosis not present

## 2014-06-02 DIAGNOSIS — H538 Other visual disturbances: Secondary | ICD-10-CM | POA: Diagnosis not present

## 2014-06-02 DIAGNOSIS — I10 Essential (primary) hypertension: Secondary | ICD-10-CM | POA: Diagnosis not present

## 2014-06-30 ENCOUNTER — Ambulatory Visit: Payer: Medicare HMO | Admitting: General Surgery

## 2014-07-09 ENCOUNTER — Ambulatory Visit
Admit: 2014-07-09 | Disposition: A | Discharge status: Home / Self Care | Payer: Self-pay | Attending: General Surgery | Admitting: General Surgery

## 2014-07-09 DIAGNOSIS — R928 Other abnormal and inconclusive findings on diagnostic imaging of breast: Secondary | ICD-10-CM | POA: Diagnosis not present

## 2014-07-09 DIAGNOSIS — N6489 Other specified disorders of breast: Secondary | ICD-10-CM | POA: Diagnosis not present

## 2014-07-09 DIAGNOSIS — Z853 Personal history of malignant neoplasm of breast: Secondary | ICD-10-CM | POA: Diagnosis not present

## 2014-07-09 DIAGNOSIS — N649 Disorder of breast, unspecified: Secondary | ICD-10-CM | POA: Diagnosis not present

## 2014-07-10 ENCOUNTER — Telehealth: Payer: Self-pay | Admitting: General Surgery

## 2014-07-10 NOTE — Telephone Encounter (Signed)
The patient was contacted after review of her July 09, 2014 mammograms. She had a concern re: left upper inner quadrant. Neg exam at that time, prompting early assessment in March 2016.  Films reviewed, tomographic images only available on final compression images.  Comparison of standard 2D mammograms shows no interval change in the right breast. Negative ultrasound reported.   In fatty replaced breast, the likelihood that there is an architectural distortion below the limit of 2-D mammogram detection that is clinically significant is approximately 1 and 1000.  I advised the patient that as this is the first time she had a 3-D tomogram study of her breast, I would not recommend biopsy at this time, but rode rather have a repeat exam in 6 months. If there is interval change then we can discuss the advisability of a wire localization biopsy.  It will not be necessary for her to come for the scheduled appointment on April 13, rather we'll defer until October as her clinical exam was negative last month.

## 2014-07-12 ENCOUNTER — Encounter: Payer: Self-pay | Admitting: General Surgery

## 2014-07-14 ENCOUNTER — Ambulatory Visit: Payer: Medicare HMO | Admitting: General Surgery

## 2014-07-22 DIAGNOSIS — H4011X3 Primary open-angle glaucoma, severe stage: Secondary | ICD-10-CM | POA: Diagnosis not present

## 2014-08-13 DIAGNOSIS — H4011X3 Primary open-angle glaucoma, severe stage: Secondary | ICD-10-CM | POA: Diagnosis not present

## 2014-09-30 DIAGNOSIS — M7521 Bicipital tendinitis, right shoulder: Secondary | ICD-10-CM | POA: Diagnosis not present

## 2014-11-09 ENCOUNTER — Other Ambulatory Visit: Payer: Self-pay

## 2014-11-09 DIAGNOSIS — N6310 Unspecified lump in the right breast, unspecified quadrant: Secondary | ICD-10-CM

## 2014-11-23 DIAGNOSIS — D649 Anemia, unspecified: Secondary | ICD-10-CM | POA: Diagnosis not present

## 2014-11-23 DIAGNOSIS — E119 Type 2 diabetes mellitus without complications: Secondary | ICD-10-CM | POA: Diagnosis not present

## 2014-11-23 DIAGNOSIS — Z0000 Encounter for general adult medical examination without abnormal findings: Secondary | ICD-10-CM | POA: Diagnosis not present

## 2014-11-23 DIAGNOSIS — C50512 Malignant neoplasm of lower-outer quadrant of left female breast: Secondary | ICD-10-CM | POA: Diagnosis not present

## 2014-12-17 DIAGNOSIS — H4011X3 Primary open-angle glaucoma, severe stage: Secondary | ICD-10-CM | POA: Diagnosis not present

## 2015-01-10 ENCOUNTER — Other Ambulatory Visit: Payer: Self-pay

## 2015-01-10 ENCOUNTER — Ambulatory Visit: Payer: Self-pay

## 2015-01-11 ENCOUNTER — Other Ambulatory Visit: Payer: Self-pay

## 2015-01-11 ENCOUNTER — Ambulatory Visit: Payer: Self-pay | Admitting: Internal Medicine

## 2015-01-18 ENCOUNTER — Ambulatory Visit
Admission: RE | Admit: 2015-01-18 | Discharge: 2015-01-18 | Disposition: A | Discharge status: Home / Self Care | Payer: Commercial Managed Care - HMO | Source: Ambulatory Visit | Attending: General Surgery | Admitting: General Surgery

## 2015-01-18 ENCOUNTER — Other Ambulatory Visit: Payer: Self-pay | Admitting: General Surgery

## 2015-01-18 DIAGNOSIS — N63 Unspecified lump in breast: Secondary | ICD-10-CM | POA: Insufficient documentation

## 2015-01-18 DIAGNOSIS — N6310 Unspecified lump in the right breast, unspecified quadrant: Secondary | ICD-10-CM

## 2015-01-18 HISTORY — DX: Personal history of irradiation: Z92.3

## 2015-01-18 HISTORY — DX: Personal history of antineoplastic chemotherapy: Z92.21

## 2015-01-19 ENCOUNTER — Other Ambulatory Visit: Payer: Self-pay

## 2015-01-19 ENCOUNTER — Ambulatory Visit: Payer: Self-pay | Admitting: Oncology

## 2015-01-20 ENCOUNTER — Inpatient Hospital Stay (HOSPITAL_BASED_OUTPATIENT_CLINIC_OR_DEPARTMENT_OTHER): Payer: Commercial Managed Care - HMO | Admitting: Oncology

## 2015-01-20 ENCOUNTER — Inpatient Hospital Stay: Payer: Commercial Managed Care - HMO | Attending: Oncology

## 2015-01-20 VITALS — BP 120/75 | HR 86 | Temp 97.8°F | Resp 16 | Wt 247.4 lb

## 2015-01-20 DIAGNOSIS — E119 Type 2 diabetes mellitus without complications: Secondary | ICD-10-CM | POA: Insufficient documentation

## 2015-01-20 DIAGNOSIS — R928 Other abnormal and inconclusive findings on diagnostic imaging of breast: Secondary | ICD-10-CM | POA: Insufficient documentation

## 2015-01-20 DIAGNOSIS — C50912 Malignant neoplasm of unspecified site of left female breast: Secondary | ICD-10-CM

## 2015-01-20 DIAGNOSIS — Z803 Family history of malignant neoplasm of breast: Secondary | ICD-10-CM | POA: Insufficient documentation

## 2015-01-20 DIAGNOSIS — Z79899 Other long term (current) drug therapy: Secondary | ICD-10-CM

## 2015-01-20 DIAGNOSIS — Z7982 Long term (current) use of aspirin: Secondary | ICD-10-CM

## 2015-01-20 DIAGNOSIS — Z853 Personal history of malignant neoplasm of breast: Secondary | ICD-10-CM | POA: Diagnosis not present

## 2015-01-20 DIAGNOSIS — Z923 Personal history of irradiation: Secondary | ICD-10-CM | POA: Diagnosis not present

## 2015-01-20 DIAGNOSIS — Z9221 Personal history of antineoplastic chemotherapy: Secondary | ICD-10-CM | POA: Insufficient documentation

## 2015-01-20 DIAGNOSIS — Z171 Estrogen receptor negative status [ER-]: Secondary | ICD-10-CM | POA: Insufficient documentation

## 2015-01-20 LAB — CBC WITH DIFFERENTIAL/PLATELET
BASOS PCT: 1 %
Basophils Absolute: 0.1 10*3/uL (ref 0–0.1)
EOS PCT: 6 %
Eosinophils Absolute: 0.4 10*3/uL (ref 0–0.7)
HEMATOCRIT: 35.5 % (ref 35.0–47.0)
Hemoglobin: 11.5 g/dL — ABNORMAL LOW (ref 12.0–16.0)
Lymphocytes Relative: 31 %
Lymphs Abs: 2.1 10*3/uL (ref 1.0–3.6)
MCH: 27.2 pg (ref 26.0–34.0)
MCHC: 32.5 g/dL (ref 32.0–36.0)
MCV: 83.6 fL (ref 80.0–100.0)
MONO ABS: 0.5 10*3/uL (ref 0.2–0.9)
MONOS PCT: 8 %
NEUTROS ABS: 3.7 10*3/uL (ref 1.4–6.5)
Neutrophils Relative %: 54 %
PLATELETS: 267 10*3/uL (ref 150–440)
RBC: 4.24 MIL/uL (ref 3.80–5.20)
RDW: 14.7 % — ABNORMAL HIGH (ref 11.5–14.5)
WBC: 6.8 10*3/uL (ref 3.6–11.0)

## 2015-01-20 LAB — FERRITIN: FERRITIN: 79 ng/mL (ref 11–307)

## 2015-01-20 LAB — IRON AND TIBC
IRON: 64 ug/dL (ref 28–170)
Saturation Ratios: 19 % (ref 10.4–31.8)
TIBC: 329 ug/dL (ref 250–450)
UIBC: 265 ug/dL

## 2015-01-20 NOTE — Progress Notes (Signed)
Patient had a mammogram 2 days ago and there was a suspicious area so they performed an ultrasound.  She has not gotten any results yet.

## 2015-01-21 LAB — CANCER ANTIGEN 27.29: CA 27.29: 9.9 U/mL (ref 0.0–38.6)

## 2015-01-24 ENCOUNTER — Ambulatory Visit (INDEPENDENT_AMBULATORY_CARE_PROVIDER_SITE_OTHER): Payer: Commercial Managed Care - HMO | Admitting: General Surgery

## 2015-01-24 ENCOUNTER — Encounter: Payer: Self-pay | Admitting: General Surgery

## 2015-01-24 VITALS — BP 128/74 | HR 78 | Resp 14 | Ht 62.0 in | Wt 247.0 lb

## 2015-01-24 DIAGNOSIS — C50912 Malignant neoplasm of unspecified site of left female breast: Secondary | ICD-10-CM | POA: Diagnosis not present

## 2015-01-24 NOTE — Patient Instructions (Signed)
Patient will be asked to return to the office in six months with a bilateral diagnotic mammogram 

## 2015-01-24 NOTE — Progress Notes (Signed)
Patient ID: Selena Contreras, female   DOB: 1945/07/12, 69 y.o.   MRN: [id number]  Chief Complaint  Patient presents with  . Follow-up    mammogram    HPI Selena Contreras is a 69 y.o. female.who presents for a breast evaluation. The most recent right breast  mammogram was done on 01/18/15.  Patient does perform regular self breast checks and gets regular mammograms done.    HPI  Past Medical History  Diagnosis Date  . Ulcer     STOMACH  . Arthritis   . Diabetes mellitus without complication ([place])     AGE 61  . Neuropathy ([place]) 2010  . Cancer [facility]) February 24,2010    Left breast:  T1 C., N0, M0, triple negative treated with wide excision, mastoplasty and sentinel node biopsy in February 2010  . S/P chemotherapy, time since greater than 12 weeks     left breast  . S/P radiation therapy > 12 wks ago     left breast cancer 2010    Past Surgical History  Procedure Laterality Date  . Breast surgery Left 2010    LEFT BREAST WIDE EXCISION  . Sentinel lymph node biopsy Left 2010  . Toe surgery    . Colonoscopy  2012    [place]  . Breast biopsy Left     negative 2007  . Breast excisional biopsy Left     positive 2010  . Breast cyst aspiration Right     2002    Family History  Problem Relation Age of Onset  . Breast cancer Sister 39  . Breast cancer Sister 71    Social History Social History  Substance Use Topics  . Smoking status: Never Smoker   . Smokeless tobacco: Never Used  . Alcohol Use: No    Allergies  Allergen Reactions  . Flagyl [Metronidazole]   . Lisinopril Cough  . Naprosyn [Naproxen]     UNKNOWN   . Tegaderm Ag Mesh [Silver]     UNKNOWN   . Doxycycline Other (See Comments)    fatigue per pt  . Tape Rash    PAPER OK TO USE     Current Outpatient Prescriptions  Medication Sig Dispense Refill  . acetaminophen (TYLENOL) 500 MG tablet Take 500 mg by mouth every 6 (six) hours as needed.    Marland Kitchen aspirin 81 MG tablet Take 81 mg by mouth  daily.    . bimatoprost (LUMIGAN) 0.01 % SOLN 1 drop at bedtime.    . calcium carbonate (OS-CAL) 600 MG TABS Take 600 mg by mouth 2 (two) times daily with a meal.    . Ferrous Sulfate (IRON) 325 (65 FE) MG TABS Take 1 tablet by mouth daily.    . fish oil-omega-3 fatty acids 1000 MG capsule Take 2 g by mouth daily.    Marland Kitchen gabapentin (NEURONTIN) 300 MG capsule 300 mg at bedtime.     Marland Kitchen glipiZIDE (GLUCOTROL) 10 MG tablet Take 10 mg by mouth 2 (two) times daily before a meal.    . losartan (COZAAR) 25 MG tablet Take 25 mg by mouth daily.    . metFORMIN (GLUCOPHAGE) 1000 MG tablet Take 1,000 mg by mouth 2 (two) times daily with a meal.    . Multiple Vitamin (MULTIVITAMIN) capsule Take 1 capsule by mouth daily.    . sitaGLIPtin (JANUVIA) 100 MG tablet Take 100 mg by mouth daily.    . timolol (BETIMOL) 0.25 % ophthalmic solution 1-2 drops 2 (two) times daily.  No current facility-administered medications for this visit.    Review of Systems Review of Systems  Constitutional: Negative.   Respiratory: Negative.   Cardiovascular: Negative.     Blood pressure 128/74, pulse 78, resp. rate 14, height 5\' 2"  (1.575 m), weight 247 lb (112.038 kg).  Physical Exam Physical Exam  Constitutional: She is oriented to person, place, and time. She appears well-developed and well-nourished.  Eyes: Conjunctivae are normal. No scleral icterus.  Neck: Neck supple.  Cardiovascular: Normal rate, regular rhythm and normal heart sounds.   Pulmonary/Chest: Effort normal and breath sounds normal. Right breast exhibits no inverted nipple, no mass, no nipple discharge, no skin change and no tenderness. Left breast exhibits no inverted nipple, no mass, no nipple discharge, no skin change and no tenderness.  Well healed left breast 3 'clock and axilla. Some darkening of the skin from radiation.   Lymphadenopathy:    She has no cervical adenopathy.    She has no axillary adenopathy.  Neurological: She is alert and  oriented to person, place, and time.  Skin: Skin is warm and dry.    Data Reviewed Six-month follow-up right breast mammogram dated 01/18/2015 was reviewed as was the report. No interval change. BI-RADS 4 based on Tomo synthesis finding.  Assessment    Doing well status post treatment of left breast cancer.  Normal mammogram, clinical exam and ultrasound, abnormal Tomo synthesis right breast    Plan    Options for management were reviewed in light of the persistent but unchanged findings in the left breast: 1) biopsying [place] versus 2) intended observation. At this time we are both comfortable with observation.    Patient will be asked to return to the office in six months with a bilateral diagnotic mammogram. PCP:  Dagoberto Reef 01/25/2015, 12:35 PM

## 2015-02-03 NOTE — Progress Notes (Signed)
[place]  [phone] [phone]  ID: Beaulah Corin OB: 1945-05-03  MR#: [id number]  QMG#:[id number]  Patient Care Team: Marinda Elk, MD as PCP - General (Physician Assistant) Robert Bellow, MD as Consulting Physician (General Surgery)  CHIEF COMPLAINT:  Chief Complaint  Patient presents with  . Breast Cancer    INTERVAL HISTORY: Patient returns to clinic today for yearly follow-up for her triple negative adenocarcinoma of the left breast. She currently feels well and is asymptomatic. She has no neurologic complaints. She denies any pain. She denies any recent fevers or illnesses. She has a good appetite and denies weight loss. She has no chest pain or shortness of breath. She denies any nausea, vomiting, constipation, or diarrhea. She has no urinary complaints. Patient feels at her baseline and offers no specific complaints today.  REVIEW OF SYSTEMS:   Review of Systems  Constitutional: Negative for fever and malaise/fatigue.  Respiratory: Negative.   Cardiovascular: Negative.   Gastrointestinal: Negative.   Musculoskeletal: Negative.   Neurological: Negative for weakness.    As per HPI. Otherwise, a complete review of systems is negatve.  PAST MEDICAL HISTORY: Past Medical History  Diagnosis Date  . Ulcer     STOMACH  . Arthritis   . Diabetes mellitus without complication ([place])     AGE 44  . Neuropathy ([place]) 2010  . Cancer [facility]) February 24,2010    Left breast:  T1 C., N0, M0, triple negative treated with wide excision, mastoplasty and sentinel node biopsy in February 2010  . S/P chemotherapy, time since greater than 12 weeks     left breast  . S/P radiation therapy > 12 wks ago     left breast cancer 2010    PAST SURGICAL HISTORY: Past Surgical History  Procedure Laterality Date  . Breast surgery Left 2010    LEFT BREAST WIDE EXCISION  . Sentinel lymph node biopsy Left 2010  . Toe surgery    .  Colonoscopy  2012    [place]  . Breast biopsy Left     negative 2007  . Breast excisional biopsy Left     positive 2010  . Breast cyst aspiration Right     2002    FAMILY HISTORY Family History  Problem Relation Age of Onset  . Breast cancer Sister 40  . Breast cancer Sister 20       ADVANCED DIRECTIVES:    HEALTH MAINTENANCE: Social History  Substance Use Topics  . Smoking status: Never Smoker   . Smokeless tobacco: Never Used  . Alcohol Use: No     Colonoscopy:  PAP:  Bone density:  Lipid panel:  Allergies  Allergen Reactions  . Flagyl [Metronidazole]   . Lisinopril Cough  . Naprosyn [Naproxen]     UNKNOWN   . Tegaderm Ag Mesh [Silver]     UNKNOWN   . Doxycycline Other (See Comments)    fatigue per pt  . Tape Rash    PAPER OK TO USE     Current Outpatient Prescriptions  Medication Sig Dispense Refill  . acetaminophen (TYLENOL) 500 MG tablet Take 500 mg by mouth every 6 (six) hours as needed.    Marland Kitchen aspirin 81 MG tablet Take 81 mg by mouth daily.    . bimatoprost (LUMIGAN) 0.01 % SOLN 1 drop at bedtime.    . calcium carbonate (OS-CAL) 600 MG TABS Take 600 mg by mouth 2 (two) times daily with a meal.    .  Ferrous Sulfate (IRON) 325 (65 FE) MG TABS Take 1 tablet by mouth daily.    . fish oil-omega-3 fatty acids 1000 MG capsule Take 2 g by mouth daily.    Marland Kitchen gabapentin (NEURONTIN) 300 MG capsule 300 mg at bedtime.     Marland Kitchen glipiZIDE (GLUCOTROL) 10 MG tablet Take 10 mg by mouth 2 (two) times daily before a meal.    . losartan (COZAAR) 25 MG tablet Take 25 mg by mouth daily.    . metFORMIN (GLUCOPHAGE) 1000 MG tablet Take 1,000 mg by mouth 2 (two) times daily with a meal.    . Multiple Vitamin (MULTIVITAMIN) capsule Take 1 capsule by mouth daily.    . sitaGLIPtin (JANUVIA) 100 MG tablet Take 100 mg by mouth daily.    . timolol (BETIMOL) 0.25 % ophthalmic solution 1-2 drops 2 (two) times daily.     No current facility-administered medications for this visit.     OBJECTIVE: Filed Vitals:   01/20/15 1130  BP: 120/75  Pulse: 86  Temp: 97.8 F (36.6 C)  Resp: 16     Body mass index is 45.23 kg/(m^2).    ECOG FS:0 - Asymptomatic  General: Well-developed, well-nourished, no acute distress. Eyes: Pink conjunctiva, anicteric sclera. Breasts: Bilateral breast and axilla without lumps or masses. Lungs: Clear to auscultation bilaterally. Heart: Regular rate and rhythm. No rubs, murmurs, or gallops. Abdomen: Soft, nontender, nondistended. No organomegaly noted, normoactive bowel sounds. Musculoskeletal: No edema, cyanosis, or clubbing. Neuro: Alert, answering all questions appropriately. Cranial nerves grossly intact. Skin: No rashes or petechiae noted. Psych: Normal affect.    LAB RESULTS:  Lab Results  Component Value Date   NA 140 10/01/2011   K 4.5 10/01/2011   CL 104 10/01/2011   CO2 27 10/01/2011   GLUCOSE 138* 10/01/2011   BUN 16 10/01/2011   CREATININE 0.89 01/06/2014   CALCIUM 10.1 10/01/2011   PROT 7.1 01/06/2014   ALBUMIN 3.2* 01/06/2014   AST 18 01/06/2014   ALT 24 01/06/2014   ALKPHOS 84 01/06/2014   BILITOT 0.3 01/06/2014   GFRNONAA >60 01/06/2014   GFRAA >60 01/06/2014    Lab Results  Component Value Date   WBC 6.8 01/20/2015   NEUTROABS 3.7 01/20/2015   HGB 11.5* 01/20/2015   HCT 35.5 01/20/2015   MCV 83.6 01/20/2015   PLT 267 01/20/2015     STUDIES: US Breast Ltd Uni Right Inc Axilla  01/18/2015  CLINICAL DATA:  Six-month follow-up of a suspicious area of distortion in the outer right breast. Biopsy was recommended at time of prior diagnostic breast exam 07/09/2014. EXAM: DIGITAL DIAGNOSTIC LEFT MAMMOGRAM WITH 3D TOMOSYNTHESIS AND CAD COMPARISON:  Previous exam(s). ACR Breast Density Category b: There are scattered areas of fibroglandular density. FINDINGS: Cc and MLO tomosynthesis was performed of the right breast demonstrating a persistent area of distortion in the upper-outer right breast. Mammographic  images were processed with CAD. Physical examination of the outer right breast does not reveal any palpable masses. A scar from patient's prior port is present in the far medial right breast. Targeted ultrasound of the right breast was performed demonstrating a cluster of oval circumscribed masses at 9 o'clock 9 cm from the nipple superficial depth measuring 0.7 x 0.4 x 0.6 cm and likely representing a cluster of cysts, corresponding to a stable mass on mammography. No sonographic correlate is present for the distortion in the outer right breast. IMPRESSION: Right breast distortion seen on mammography only. RECOMMENDATION: Tomosynthesis guided biopsy (on the  Affirm biopsy unit) of the distortion in the outer right breast is recommended. This is available at the [place]. This has been discussed with the patient. She will first follow-up with her surgeon, Dr. Bary Castilla, next week prior to scheduling the biopsy. I have discussed the findings and recommendations with the patient. Results were also provided in writing at the conclusion of the visit. If applicable, a reminder letter will be sent to the patient regarding the next appointment. BI-RADS CATEGORY  4: Suspicious. Electronically Signed   By: Everlean Alstrom M.D.   On: 01/18/2015 12:11   Mm Diag Breast Tomo Uni Right  01/18/2015  CLINICAL DATA:  Six-month follow-up of a suspicious area of distortion in the outer right breast. Biopsy was recommended at time of prior diagnostic breast exam 07/09/2014. EXAM: DIGITAL DIAGNOSTIC LEFT MAMMOGRAM WITH 3D TOMOSYNTHESIS AND CAD COMPARISON:  Previous exam(s). ACR Breast Density Category b: There are scattered areas of fibroglandular density. FINDINGS: Cc and MLO tomosynthesis was performed of the right breast demonstrating a persistent area of distortion in the upper-outer right breast. Mammographic images were processed with CAD. Physical examination of the outer right breast does not reveal any  palpable masses. A scar from patient's prior port is present in the far medial right breast. Targeted ultrasound of the right breast was performed demonstrating a cluster of oval circumscribed masses at 9 o'clock 9 cm from the nipple superficial depth measuring 0.7 x 0.4 x 0.6 cm and likely representing a cluster of cysts, corresponding to a stable mass on mammography. No sonographic correlate is present for the distortion in the outer right breast. IMPRESSION: Right breast distortion seen on mammography only. RECOMMENDATION: Tomosynthesis guided biopsy (on the Affirm biopsy unit) of the distortion in the outer right breast is recommended. This is available at the [place]. This has been discussed with the patient. She will first follow-up with her surgeon, Dr. Bary Castilla, next week prior to scheduling the biopsy. I have discussed the findings and recommendations with the patient. Results were also provided in writing at the conclusion of the visit. If applicable, a reminder letter will be sent to the patient regarding the next appointment. BI-RADS CATEGORY  4: Suspicious. Electronically Signed   By: Everlean Alstrom M.D.   On: 01/18/2015 12:11    ASSESSMENT: Stage I triple negative adenocarcinoma of the left breast.  PLAN:    1. Breast cancer: No evidence of disease. Patient's CA-27-29 continues to be within normal limits. She had a recent mammogram on January 18, 2015 that was reported as BI-RADS 4. She has follow-up with her surgeon in the next week to further evaluate and consideration of biopsy. Patient completed chemotherapy with Adriamycin, Cytoxan, and 12 doses of weekly Taxol in August 2010. She completed her XRT in October 2010. She did not require an aromatase inhibitor given the triple negative status of her disease.  No intervention is needed at this time unless patient has a positive biopsy. Return to clinic in 1 year for routine follow-up.    Patient expressed understanding  and was in agreement with this plan. She also understands that She can call clinic at any time with any questions, concerns, or complaints.     Lloyd Huger, MD   02/03/2015 10:10 AM

## 2015-03-18 DIAGNOSIS — H401133 Primary open-angle glaucoma, bilateral, severe stage: Secondary | ICD-10-CM | POA: Diagnosis not present

## 2015-03-21 DIAGNOSIS — G629 Polyneuropathy, unspecified: Secondary | ICD-10-CM | POA: Diagnosis not present

## 2015-03-21 DIAGNOSIS — E1140 Type 2 diabetes mellitus with diabetic neuropathy, unspecified: Secondary | ICD-10-CM | POA: Diagnosis not present

## 2015-03-21 DIAGNOSIS — E119 Type 2 diabetes mellitus without complications: Secondary | ICD-10-CM | POA: Diagnosis not present

## 2015-03-21 DIAGNOSIS — D649 Anemia, unspecified: Secondary | ICD-10-CM | POA: Diagnosis not present

## 2015-03-21 DIAGNOSIS — C50912 Malignant neoplasm of unspecified site of left female breast: Secondary | ICD-10-CM | POA: Diagnosis not present

## 2015-03-21 DIAGNOSIS — Z6841 Body Mass Index (BMI) 40.0 and over, adult: Secondary | ICD-10-CM | POA: Diagnosis not present

## 2015-04-07 DIAGNOSIS — G5791 Unspecified mononeuropathy of right lower limb: Secondary | ICD-10-CM | POA: Diagnosis not present

## 2015-04-07 DIAGNOSIS — R200 Anesthesia of skin: Secondary | ICD-10-CM | POA: Diagnosis not present

## 2015-04-07 DIAGNOSIS — M5441 Lumbago with sciatica, right side: Secondary | ICD-10-CM | POA: Diagnosis not present

## 2015-04-25 DIAGNOSIS — N950 Postmenopausal bleeding: Secondary | ICD-10-CM | POA: Diagnosis not present

## 2015-05-02 DIAGNOSIS — N949 Unspecified condition associated with female genital organs and menstrual cycle: Secondary | ICD-10-CM | POA: Diagnosis not present

## 2015-05-02 DIAGNOSIS — N840 Polyp of corpus uteri: Secondary | ICD-10-CM | POA: Diagnosis not present

## 2015-05-02 DIAGNOSIS — N841 Polyp of cervix uteri: Secondary | ICD-10-CM | POA: Diagnosis not present

## 2015-05-02 DIAGNOSIS — N950 Postmenopausal bleeding: Secondary | ICD-10-CM | POA: Diagnosis not present

## 2015-05-03 ENCOUNTER — Other Ambulatory Visit: Payer: Self-pay | Admitting: *Deleted

## 2015-05-03 DIAGNOSIS — C50912 Malignant neoplasm of unspecified site of left female breast: Secondary | ICD-10-CM

## 2015-05-10 ENCOUNTER — Encounter: Payer: Self-pay | Admitting: *Deleted

## 2015-05-17 DIAGNOSIS — H401133 Primary open-angle glaucoma, bilateral, severe stage: Secondary | ICD-10-CM | POA: Diagnosis not present

## 2015-05-18 DIAGNOSIS — D649 Anemia, unspecified: Secondary | ICD-10-CM | POA: Diagnosis not present

## 2015-05-18 DIAGNOSIS — C50512 Malignant neoplasm of lower-outer quadrant of left female breast: Secondary | ICD-10-CM | POA: Diagnosis not present

## 2015-05-18 DIAGNOSIS — E119 Type 2 diabetes mellitus without complications: Secondary | ICD-10-CM | POA: Diagnosis not present

## 2015-05-25 DIAGNOSIS — G63 Polyneuropathy in diseases classified elsewhere: Secondary | ICD-10-CM | POA: Diagnosis not present

## 2015-05-25 DIAGNOSIS — E1142 Type 2 diabetes mellitus with diabetic polyneuropathy: Secondary | ICD-10-CM | POA: Diagnosis not present

## 2015-05-25 DIAGNOSIS — E782 Mixed hyperlipidemia: Secondary | ICD-10-CM | POA: Diagnosis not present

## 2015-05-25 DIAGNOSIS — Z6841 Body Mass Index (BMI) 40.0 and over, adult: Secondary | ICD-10-CM

## 2015-05-25 DIAGNOSIS — D649 Anemia, unspecified: Secondary | ICD-10-CM | POA: Diagnosis not present

## 2015-05-25 DIAGNOSIS — E1140 Type 2 diabetes mellitus with diabetic neuropathy, unspecified: Secondary | ICD-10-CM | POA: Diagnosis not present

## 2015-06-03 DIAGNOSIS — H401133 Primary open-angle glaucoma, bilateral, severe stage: Secondary | ICD-10-CM | POA: Diagnosis not present

## 2015-06-07 DIAGNOSIS — N950 Postmenopausal bleeding: Secondary | ICD-10-CM | POA: Diagnosis not present

## 2015-06-09 ENCOUNTER — Telehealth: Payer: Self-pay | Admitting: *Deleted

## 2015-06-09 NOTE — Telephone Encounter (Signed)
Called and left message for patient to call the office back regarding her appointment on April 17th at 9:00am. We need to move that appt to a later time. Dr Bary Castilla will be in late that morning.

## 2015-06-17 NOTE — H&P (Signed)
Ms. Stanhope is a 70 y.o. female here for fractional dilation and curettage , hysteroscopy and myosure resection of an endometrial mass . Pt with PMB . Endocervical polyp removed benign and endometrial Bx , polyp otherwise benign  U/s today shows 4 fibroids with the largest 35 mm  . SIS shows endometrial mass 1.1x1.0 cm  Past Medical History:  has a past medical history of Anemia, unspecified (05/27/2012); Back pain (06/20/2012); Breast cancer (CMS-HCC); Breast cancer in situ (2010); Cataracts, bilateral (04/09/2012); Diabetes mellitus type 2, uncomplicated (CMS-HCC); Glaucoma; Hypertension; and Peripheral neuropathy (CMS-HCC).  Past Surgical History:  has a past surgical history that includes Mastectomy partial / lumpectomy (Left); toe surgery; and glaucoma eye surgery (Left, 06/12/13). Family History: family history includes Asthma in her sister and sister; Breast cancer in her sister; COPD in her sister; Cancer in her brother; Dementia in her mother; Diabetes type II in her brother, father, and mother; Fibromyalgia in her sister; Glaucoma in her mother, sister, and sister; Hypertension in her father; Macular degeneration in her sister and sister; Obesity in her sister, sister, sister, and sister; Osteoarthritis in her brother, brother, sister, and sister; Osteoporosis in her sister; Other in her mother and sister; Rheum arthritis in her sister; Seizures in her sister. Social History:  reports that she has never smoked. She has never used smokeless tobacco. She reports that she does not drink alcohol or use illicit drugs. OB/GYN History:  OB History    Gravida Para Term Preterm AB TAB SAB Ectopic Multiple Living   _0 Allergies: is allergic to flagyl [metronidazole hcl]; adhesive tape-silicones; doxycycline; latex, natural rubber; lisinopril; and naprosyn [naproxen]. Medications:  Current Outpatient Prescriptions:  . acetaminophen (TYLENOL) 500 mg capsule, Take 500 mg by mouth  every 4 (four) hours as needed. , Disp: , Rfl:  . aspirin 81 MG EC tablet, Take 81 mg by mouth daily., Disp: , Rfl:  . BD ALCOHOL SWABS PadM, , Disp: , Rfl:  . blood glucose control, normal (ACCU-CHEK SMARTVIEW CONTRL SOL) Soln, Use as directed with glucose meter, Disp: 1 each, Rfl: 3 . blood glucose diagnostic test strip, Check fasting blood sugar daily 250.00, Disp: 100 each, Rfl: 12 . blood glucose meter kit, Of patient's choice. Check blood sugar once daily 250.00, Disp: 1 each, Rfl: 0 . calcium-vitamin D 500-125 mg-unit tablet, Take 1 tablet by mouth 2 (two) times daily., Disp: , Rfl:  . ferrous sulfate 325 (65 FE) MG tablet, Take 325 mg by mouth daily with breakfast., Disp: , Rfl:  . fexofenadine-pseudoephedrine (ALLEGRA-D 24H) 180-240 mg ER tablet, Take 1 tablet by mouth as needed. , Disp: , Rfl:  . fluticasone (FLONASE) 50 mcg/actuation nasal spray, Place 2 sprays into both nostrils once daily., Disp: 48 g, Rfl: 1 . gabapentin (NEURONTIN) 300 MG capsule, Take 1 capsule (300 mg total) by mouth nightly., Disp: 90 capsule, Rfl: 1 . glipiZIDE (GLUCOTROL) 10 MG tablet, Take 1 tablet (10 mg total) by mouth 2 (two) times daily before meals., Disp: 180 tablet, Rfl: 1 . lancets, Check fasting blood sugar daily 250.00, Disp: 100 each, Rfl: 12 . latanoprost (XALATAN) 0.005 % ophthalmic solution, Place 1 drop into both eyes nightly., Disp: 7.5 mL, Rfl: 1 . losartan (COZAAR) 25 MG tablet, Take 1 tablet (25 mg total) by mouth once daily., Disp: 90 tablet, Rfl: 1 . metFORMIN (GLUCOPHAGE) 1000 MG tablet, Take 1 tablet (1,000 mg total) by mouth 2 (two)  times daily with meals., Disp: 180 tablet, Rfl: 1 . multivitamin with minerals tablet, Take 1 tablet by mouth once daily. , Disp: , Rfl:  . OMEGA-3/DHA/EPA/FISH OIL (FISH OIL-OMEGA-3 FATTY ACIDS) 300-1,000 mg capsule, Take 2 g by mouth daily., Disp: , Rfl:  . sitaGLIPtin (JANUVIA) 100 MG tablet, 1 po daily, Disp: 90 tablet, Rfl: 1  Review of  Systems: General:   No fatigue or weight loss Eyes:   No vision changes Ears:   No hearing difficulty Respiratory:   No cough or shortness of breath Pulmonary:   No asthma or shortness of breath Cardiovascular:  No chest pain, palpitations, dyspnea on exertion Gastrointestinal:  No abdominal bloating, chronic diarrhea, constipations, masses, pain or hematochezia Genitourinary:  No hematuria, dysuria, abnormal vaginal discharge, pelvic pain, Menometrorrhagia Lymphatic:  No swollen lymph nodes Musculoskeletal: No muscle weakness Neurologic:  No extremity weakness, syncope, seizure disorder Psychiatric:  No history of depression, delusions or suicidal/homicidal ideation   Exam:      Vitals:   06/07/15 1533  BP: 122/66  Pulse: 88    Body mass index is 44.45 kg/(m^2).  WDWN black female in NAD  Cv RRR without murmur  Lungs  CTA without rales  abd : soft NT  Pelvic: tanner stage 5 ,  External genitalia: vulva /labia no lesions Urethra: no prolapse Vagina: normal physiologic d/c Cervix: no lesions, no cervical motion tenderness  Uterus: normal size shape and contour, non-tender Adnexa: no mass, non-tender  Saline infusion sonohysterography: betadine prep to the cervix followed by placement of the HSG catheter into the endometrial canal . Sterile H2O is injected while performing a transvaginal u/s . Findings:1.1x1.0 cm   Impression:   The primary encounter diagnosis was PMB (postmenopausal bleeding). A diagnosis of Endometrial mass was also pertinent to this visit.  Most consistent with a endometrial polyp vs fibroid  Plan:  Results discussed with the pt . I have recommended a Fractional D+C , H/s and probable Myosure resection  She agrees. The risks of the procedure have been discussed with her . All questions answered     Caroline Sauger, MD       Instructions      Return if symptoms worsen or fail to improve, for preop.  Order-Level  Documents:  There are no order-level documents.  Encounter-Level Documents:  There are no encounter-level documents.  Additional Documentation  Vitals:    BP 122/66    Pulse 88    Wt  110.2 kg (243 lb)    BMI 44.45 kg/m2    BSA 2.2 m2   Flowsheets:    Custom Formula Data,    Anthropometrics      Encounter Info:    Classic Report,    Allergies,    Care Plan,    Education,    History,    Anticoag,    Billing,    Dialysis History,    Med Rec,    Patient Screenings,    Patient Entered Data,    Smoking Audit      Orders Placed    None  Medication Changes     None    Medication List  Visit Diagnoses      PMB (postmenopausal bleeding) N95.0     Endometrial mass N94.89    Problem List

## 2015-06-22 ENCOUNTER — Encounter
Admission: RE | Admit: 2015-06-22 | Discharge: 2015-06-22 | Disposition: A | Discharge status: Home / Self Care | Payer: Commercial Managed Care - HMO | Source: Ambulatory Visit | Attending: Obstetrics and Gynecology | Admitting: Obstetrics and Gynecology

## 2015-06-22 DIAGNOSIS — Z01812 Encounter for preprocedural laboratory examination: Secondary | ICD-10-CM | POA: Insufficient documentation

## 2015-06-22 HISTORY — DX: Unspecified cataract: H26.9

## 2015-06-22 HISTORY — DX: Anemia, unspecified: D64.9

## 2015-06-22 HISTORY — DX: Unspecified glaucoma: H40.9

## 2015-06-22 HISTORY — DX: Other complications of anesthesia, initial encounter: T88.59XA

## 2015-06-22 HISTORY — DX: Essential (primary) hypertension: I10

## 2015-06-22 HISTORY — DX: Partial retinal artery occlusion, bilateral: H34.213

## 2015-06-22 HISTORY — DX: Adverse effect of unspecified anesthetic, initial encounter: T41.45XA

## 2015-06-22 LAB — BASIC METABOLIC PANEL
Anion gap: 6 (ref 5–15)
BUN: 19 mg/dL (ref 6–20)
CHLORIDE: 102 mmol/L (ref 101–111)
CO2: 25 mmol/L (ref 22–32)
Calcium: 8.9 mg/dL (ref 8.9–10.3)
Creatinine, Ser: 0.81 mg/dL (ref 0.44–1.00)
GFR calc Af Amer: >60 mL/min (ref >60)
GFR calc non Af Amer: >60 mL/min (ref >60)
Glucose, Bld: 195 mg/dL — ABNORMAL HIGH (ref 65–99)
POTASSIUM: 4.2 mmol/L (ref 3.5–5.1)
SODIUM: 133 mmol/L — AB (ref 135–145)

## 2015-06-22 LAB — TYPE AND SCREEN
ABO/RH(D): O POS
Antibody Screen: NEGATIVE

## 2015-06-22 LAB — CBC
HEMATOCRIT: 36.4 % (ref 35.0–47.0)
HEMOGLOBIN: 11.7 g/dL — AB (ref 12.0–16.0)
MCH: 27.8 pg (ref 26.0–34.0)
MCHC: 32.1 g/dL (ref 32.0–36.0)
MCV: 86.6 fL (ref 80.0–100.0)
Platelets: 242 10*3/uL (ref 150–440)
RBC: 4.2 MIL/uL (ref 3.80–5.20)
RDW: 14.4 % (ref 11.5–14.5)
WBC: 7.9 10*3/uL (ref 3.6–11.0)

## 2015-06-22 LAB — ABO/RH: ABO/RH(D): O POS

## 2015-06-22 NOTE — Patient Instructions (Signed)
  Your procedure is scheduled on: 07/01/15 Report to Day Surgery. [facility]OND FLOOR To find out your arrival time please call [phone] between 1PM - 3PM on 06/30/15  Remember: Instructions that are not followed completely may result in serious medical risk, up to and including death, or upon the discretion of your surgeon and anesthesiologist your surgery may need to be rescheduled.    __X__ 1. Do not eat food or drink liquids after midnight. No gum chewing or hard candies.     _X___ 2. No Alcohol for 24 hours before or after surgery.   ____ 3. Bring all medications with you on the day of surgery if instructed.    __X__ 4. Notify your doctor if there is any change in your medical condition     (cold, fever, infections).     Do not wear jewelry, make-up, hairpins, clips or nail polish.  Do not wear lotions, powders, or perfumes. You may wear deodorant.  Do not shave 48 hours prior to surgery. Men may shave face and neck.  Do not bring valuables to the hospital.    [facility] is not responsible for any belongings or valuables.               Contacts, dentures or bridgework may not be worn into surgery.  Leave your suitcase in the car. After surgery it may be brought to your room.  For patients admitted to the hospital, discharge time is determined by your                treatment team.   Patients discharged the day of surgery will not be allowed to drive home.   Please read over the following fact sheets that you were given:   Surgical Site Infection Prevention   ____ Take these medicines the morning of surgery with A SIP OF WATER:    1. NONE  2.   3.   4.  5.  6.  ____ Fleet Enema (as directed)   ____ Use CHG Soap as directed  ____ Use inhalers on the day of surgery  __X__ Stop metformin 2 days prior to surgery    ____ Take 1/2 of usual insulin dose the night before surgery and none on the morning of surgery.   __X __ Stop Coumadin/Plavix/aspirin on      STOP ASPIRIN UNTIL AFTER SURGERY  ____ Stop Anti-inflammatories on   __X__ Stop supplements until after surgery.  STOP FISH OIL UNTIL AFTER SURGERY  ____ Bring C-Pap to the hospital.   [place]

## 2015-06-22 NOTE — Pre-Procedure Instructions (Signed)
Value Range  Vent Rate (bpm) 72   PR Interval (msec) 156   QRS Interval (msec) 100   QT Interval (msec) 400   QTc (msec) 438    Result Narrative  Normal sinus rhythm Normal ECG When compared with ECG of 23-Nov-2014 11:06, Previous ECG has undetermined rhythm, needs review I reviewed and concur with this report. Electronically signed [id number] MD, [place] [id number]) on 11/29/2014 12:39:58 PM   Status Results Details   Encounter Summary

## 2015-06-22 NOTE — Pre-Procedure Instructions (Signed)
LEAH IN OR NOTIFIED LATEX ALLERGY

## 2015-07-01 ENCOUNTER — Ambulatory Visit
Admission: RE | Admit: 2015-07-01 | Discharge: 2015-07-01 | Disposition: A | Discharge status: Home / Self Care | Payer: Commercial Managed Care - HMO | Source: Ambulatory Visit | Attending: Obstetrics and Gynecology | Admitting: Obstetrics and Gynecology

## 2015-07-01 ENCOUNTER — Ambulatory Visit: Payer: Commercial Managed Care - HMO | Admitting: Anesthesiology

## 2015-07-01 ENCOUNTER — Encounter: Payer: Self-pay | Admitting: Emergency Medicine

## 2015-07-01 ENCOUNTER — Encounter
Admission: RE | Disposition: A | Discharge status: Home / Self Care | Payer: Self-pay | Source: Ambulatory Visit | Attending: Obstetrics and Gynecology

## 2015-07-01 DIAGNOSIS — Z8262 Family history of osteoporosis: Secondary | ICD-10-CM | POA: Diagnosis not present

## 2015-07-01 DIAGNOSIS — Z7982 Long term (current) use of aspirin: Secondary | ICD-10-CM | POA: Insufficient documentation

## 2015-07-01 DIAGNOSIS — D259 Leiomyoma of uterus, unspecified: Secondary | ICD-10-CM | POA: Diagnosis not present

## 2015-07-01 DIAGNOSIS — Z8261 Family history of arthritis: Secondary | ICD-10-CM | POA: Diagnosis not present

## 2015-07-01 DIAGNOSIS — E1140 Type 2 diabetes mellitus with diabetic neuropathy, unspecified: Secondary | ICD-10-CM | POA: Diagnosis not present

## 2015-07-01 DIAGNOSIS — Z8249 Family history of ischemic heart disease and other diseases of the circulatory system: Secondary | ICD-10-CM | POA: Diagnosis not present

## 2015-07-01 DIAGNOSIS — Z83511 Family history of glaucoma: Secondary | ICD-10-CM | POA: Insufficient documentation

## 2015-07-01 DIAGNOSIS — Z825 Family history of asthma and other chronic lower respiratory diseases: Secondary | ICD-10-CM | POA: Insufficient documentation

## 2015-07-01 DIAGNOSIS — Z79899 Other long term (current) drug therapy: Secondary | ICD-10-CM | POA: Insufficient documentation

## 2015-07-01 DIAGNOSIS — N858 Other specified noninflammatory disorders of uterus: Secondary | ICD-10-CM | POA: Diagnosis not present

## 2015-07-01 DIAGNOSIS — Z803 Family history of malignant neoplasm of breast: Secondary | ICD-10-CM | POA: Insufficient documentation

## 2015-07-01 DIAGNOSIS — Z818 Family history of other mental and behavioral disorders: Secondary | ICD-10-CM | POA: Insufficient documentation

## 2015-07-01 DIAGNOSIS — N950 Postmenopausal bleeding: Secondary | ICD-10-CM | POA: Diagnosis not present

## 2015-07-01 DIAGNOSIS — D649 Anemia, unspecified: Secondary | ICD-10-CM | POA: Diagnosis not present

## 2015-07-01 DIAGNOSIS — H269 Unspecified cataract: Secondary | ICD-10-CM | POA: Diagnosis not present

## 2015-07-01 DIAGNOSIS — I10 Essential (primary) hypertension: Secondary | ICD-10-CM | POA: Diagnosis not present

## 2015-07-01 DIAGNOSIS — N840 Polyp of corpus uteri: Secondary | ICD-10-CM | POA: Insufficient documentation

## 2015-07-01 DIAGNOSIS — Z9889 Other specified postprocedural states: Secondary | ICD-10-CM | POA: Insufficient documentation

## 2015-07-01 DIAGNOSIS — I739 Peripheral vascular disease, unspecified: Secondary | ICD-10-CM | POA: Diagnosis not present

## 2015-07-01 DIAGNOSIS — Z9012 Acquired absence of left breast and nipple: Secondary | ICD-10-CM | POA: Diagnosis not present

## 2015-07-01 DIAGNOSIS — Z7984 Long term (current) use of oral hypoglycemic drugs: Secondary | ICD-10-CM | POA: Diagnosis not present

## 2015-07-01 DIAGNOSIS — Z853 Personal history of malignant neoplasm of breast: Secondary | ICD-10-CM | POA: Insufficient documentation

## 2015-07-01 DIAGNOSIS — Z833 Family history of diabetes mellitus: Secondary | ICD-10-CM | POA: Insufficient documentation

## 2015-07-01 DIAGNOSIS — Z7951 Long term (current) use of inhaled steroids: Secondary | ICD-10-CM | POA: Diagnosis not present

## 2015-07-01 DIAGNOSIS — Z820 Family history of epilepsy and other diseases of the nervous system: Secondary | ICD-10-CM | POA: Diagnosis not present

## 2015-07-01 HISTORY — PX: DILATATION & CURETTAGE/HYSTEROSCOPY WITH MYOSURE: SHX6511

## 2015-07-01 LAB — GLUCOSE, CAPILLARY
GLUCOSE-CAPILLARY: 141 mg/dL — AB (ref 65–99)
Glucose-Capillary: 151 mg/dL — ABNORMAL HIGH (ref 65–99)

## 2015-07-01 SURGERY — DILATATION & CURETTAGE/HYSTEROSCOPY WITH MYOSURE
Anesthesia: General

## 2015-07-01 MED ORDER — HYDROCODONE-ACETAMINOPHEN 7.5-325 MG PO TABS
1.0000 | ORAL_TABLET | Freq: Once | ORAL | Status: DC | PRN
  Filled 2015-07-01: qty 1

## 2015-07-01 MED ORDER — FENTANYL CITRATE (PF) 100 MCG/2ML IJ SOLN
INTRAMUSCULAR | Status: DC | PRN
  Administered 2015-07-01 (×4): 25 ug via INTRAVENOUS

## 2015-07-01 MED ORDER — LIDOCAINE HCL (CARDIAC) 20 MG/ML IV SOLN
INTRAVENOUS | Status: DC | PRN
  Administered 2015-07-01: 60 mg via INTRAVENOUS

## 2015-07-01 MED ORDER — FAMOTIDINE 20 MG PO TABS
20.0000 mg | ORAL_TABLET | Freq: Once | ORAL | Status: AC
  Administered 2015-07-01: 20 mg via ORAL

## 2015-07-01 MED ORDER — SODIUM CHLORIDE 0.9 % IV SOLN
INTRAVENOUS | Status: DC
  Administered 2015-07-01: 75 mL/h via INTRAVENOUS

## 2015-07-01 MED ORDER — FAMOTIDINE 20 MG PO TABS
ORAL_TABLET | ORAL | Status: AC
  Administered 2015-07-01: 20 mg via ORAL
  Filled 2015-07-01: qty 1

## 2015-07-01 MED ORDER — MIDAZOLAM HCL 2 MG/2ML IJ SOLN
INTRAMUSCULAR | Status: DC | PRN
  Administered 2015-07-01: 2 mg via INTRAVENOUS

## 2015-07-01 MED ORDER — CEFOXITIN SODIUM-DEXTROSE 2-2.2 GM-% IV SOLR (PREMIX)
2.0000 g | INTRAVENOUS | Status: AC
  Administered 2015-07-01: 2 g via INTRAVENOUS

## 2015-07-01 MED ORDER — PROPOFOL 10 MG/ML IV BOLUS
INTRAVENOUS | Status: DC | PRN
  Administered 2015-07-01: 120 mg via INTRAVENOUS

## 2015-07-01 MED ORDER — DEXAMETHASONE SODIUM PHOSPHATE 10 MG/ML IJ SOLN
INTRAMUSCULAR | Status: DC | PRN
  Administered 2015-07-01: 5 mg via INTRAVENOUS

## 2015-07-01 MED ORDER — CEFOXITIN SODIUM-DEXTROSE 2-2.2 GM-% IV SOLR (PREMIX)
INTRAVENOUS | Status: AC
  Filled 2015-07-01: qty 50

## 2015-07-01 MED ORDER — ONDANSETRON HCL 4 MG/2ML IJ SOLN
INTRAMUSCULAR | Status: DC | PRN
  Administered 2015-07-01: 4 mg via INTRAVENOUS

## 2015-07-01 SURGICAL SUPPLY — 20 items
ABLATOR ENDOMETRIAL MYOSURE (ABLATOR) ×3 IMPLANT
CANISTER SUC SOCK COL 7IN (MISCELLANEOUS) ×3 IMPLANT
CANISTER SUCT 3000ML (MISCELLANEOUS) ×3 IMPLANT
CATH ROBINSON RED A/P 16FR (CATHETERS) ×3 IMPLANT
GLOVE BIO SURGEON STRL SZ8 (GLOVE) ×18 IMPLANT
GOWN STRL REUS W/ TWL LRG LVL3 (GOWN DISPOSABLE) ×1 IMPLANT
GOWN STRL REUS W/ TWL XL LVL3 (GOWN DISPOSABLE) ×1 IMPLANT
GOWN STRL REUS W/TWL LRG LVL3 (GOWN DISPOSABLE) ×2
GOWN STRL REUS W/TWL XL LVL3 (GOWN DISPOSABLE) ×2
KIT RM TURNOVER CYSTO AR (KITS) ×3 IMPLANT
MYOSURE LITE POLYP REMOVAL (MISCELLANEOUS) IMPLANT
PACK DNC HYST (MISCELLANEOUS) ×3 IMPLANT
PAD OB MATERNITY 4.3X12.25 (PERSONAL CARE ITEMS) ×3 IMPLANT
PAD PREP 24X41 OB/GYN DISP (PERSONAL CARE ITEMS) ×3 IMPLANT
SOL .9 NS 3000ML IRR  AL (IV SOLUTION) ×2
SOL .9 NS 3000ML IRR UROMATIC (IV SOLUTION) ×1 IMPLANT
TOWEL OR 17X26 4PK STRL BLUE (TOWEL DISPOSABLE) ×3 IMPLANT
TUBING CONNECTING 10 (TUBING) ×2 IMPLANT
TUBING CONNECTING 10' (TUBING) ×1
TUBING HYSTEROSCOPY DOLPHIN (MISCELLANEOUS) ×3 IMPLANT

## 2015-07-01 NOTE — Op Note (Signed)
[name], [name]             ACCOUNT NO.:  [id number]  MEDICAL RECORD NO.:  [id number]  LOCATION:  [facility]                         FACILITY:  [facility]  PHYSICIAN:  [name], MDDATE OF BIRTH:  [birth year]  DATE OF PROCEDURE: DATE OF DISCHARGE:                              OPERATIVE REPORT   PREOPERATIVE DIAGNOSIS: 1. Postmenopausal bleeding. 2. Endometrial polyp.  POSTOPERATIVE DIAGNOSIS: 1. Postmenopausal bleeding. 2. Multiple endometrial fibroids and isolated endometrial polyp.  PROCEDURE PERFORMED: 1. Fractional dilation curettage. 2. MyoSure endometrial resection of fibroid and polyp.  SURGEON:  [name], MD  ANESTHESIA:  General endotracheal anesthesia.  SURGEON:  [name], MD  FIRST ASSISTANT:  Eulas Post, PA student.  INDICATION:  A 70 year old, gravida 5, para 2, patient with postmenopausal bleeding, underwent a workup in the clinic that revealed a negative endometrial biopsy and saline ultrasound revealed 1 x 1 cm endometrial mass consistent with a polyp.  DESCRIPTION OF PROCEDURE:  After adequate general endotracheal anesthesia, patient was placed in dorsal supine position.  Legs placed in candy-cane stirrups.  Abdomen, perineum, and vagina were prepped and draped in normal sterile fashion.  Time-out was performed.  The patient did receive 2 g IV cefoxitin prior to commencement of the case.  A weighted speculum was placed in the posterior vagina followed by draining of the bladder yielding 150 mL clear urine with catheter. Anterior vagina was elevated with Sims retractor and the anterior cervix was grasped with a single-tooth tenaculum.  An endocervical curettage was performed with scant tissue removed.  Uterus was then sounded to 7 cm and then dilated to #15 Hanks dilator without difficulty. Hysteroscope was advanced into the endometrial cavity without difficulty.  Lactated Ringer's was used as a distending medium.   An initial impression revealed several large pedunculated white endometrial masses consistent with fibroids.  There was a small 3 x 3 mm fleshy polyp at the 8 o'clock position, mid endometrial cavity.  The MyoSure was brought up to the operative field and for the next 45 minutes, what appeared to be 3 separate endometrial masses/fibroids were resected, residual base with the fibroids resulted given the broad base attachment to the endometrial cavity.  Intraop pictures were taken.  There was minimal bleeding.  Distending medium, lactated Ringer 7700 mL used and there was 0 deficit.  Instruments were removed.  There was no evident bleeding at the cervical os and single-tooth tenaculum was removed. Good hemostasis noted.  There were no complications.  ESTIMATED BLOOD LOSS:  Minimal.  INTRAOPERATIVE FLUIDS:  700 mL.  MyoSure, distending medium, lactated Ringer 7700 used, 0 deficit.  Urine output 150 mL.  Patient was taken to recovery room in good condition.          ______________________________ [name], MD     TS/MEDQ  D:  07/01/2015  T:  07/01/2015  Job:  [id number]

## 2015-07-01 NOTE — Progress Notes (Signed)
Pt interviewed . NPO . Ready for Fx D+C and myosure resection . Labs reviewed . All questions answered .  Proceed.

## 2015-07-01 NOTE — Discharge Instructions (Signed)

## 2015-07-01 NOTE — Transfer of Care (Signed)
Immediate Anesthesia Transfer of Care Note  Patient: Selena Contreras  Procedure(s) Performed: Procedure(s): DILATATION & CURETTAGE/HYSTEROSCOPY WITH MYOSURE (N/A)  Patient Location: PACU  Anesthesia Type:General  Level of Consciousness: awake  Airway & Oxygen Therapy: Patient Spontanous Breathing and Patient connected to face mask oxygen  Post-op Assessment: Report given to RN  Post vital signs: Reviewed  Last Vitals:  Filed Vitals:   07/01/15 0646 07/01/15 0856  BP: 131/80 113/70  Pulse: 74 72  Temp: 36.6 C 36.2 C  Resp: 20 15    Complications: No apparent anesthesia complications

## 2015-07-01 NOTE — Brief Op Note (Signed)
07/01/2015  8:48 AM  PATIENT:  Selena Contreras  69 y.o. female  PRE-OPERATIVE DIAGNOSIS:  endometrial mass, PMB  POST-OPERATIVE DIAGNOSIS:  endometrial mass, PMB  PROCEDURE:  Procedure(s): DILATATION & CURETTAGE/HYSTEROSCOPY WITH MYOSURE (N/A)  SURGEON:  Surgeon(s) and Role:    Boykin Nearing, MD - Primary  PHYSICIAN ASSISTANT: Eulas Post , PA student   ASSISTANTS: none none ANESTHESIA:   general  EBL:  Total I/O In: 700 [I.V.:700] Out: 155 [Urine:150; Blood:5]  LR distending medium 7700 cc use , 0 net defecit  BLOOD ADMINISTERED:none  DRAINS: none   LOCAL MEDICATIONS USED:  NONE  SPECIMEN:  Source of Specimen:  ECC , endometrial curettings of fibroids and polyp   DISPOSITION OF SPECIMEN:  PATHOLOGY  COUNTS:  YES  TOURNIQUET:  * No tourniquets in log *  DICTATION: .Other Dictation: Dictation Number verbal  PLAN OF CARE: Discharge to home after PACU  PATIENT DISPOSITION:  PACU - hemodynamically stable.   Delay start of Pharmacological VTE agent (>24hrs) due to surgical blood loss or risk of bleeding: not applicable

## 2015-07-01 NOTE — Anesthesia Procedure Notes (Signed)
Procedure Name: LMA Insertion Date/Time: 07/01/2015 7:36 AM Performed by: Aline Brochure Pre-anesthesia Checklist: Patient identified, Emergency Drugs available, Suction available and Patient being monitored Patient Re-evaluated:Patient Re-evaluated prior to inductionOxygen Delivery Method: Circle system utilized Preoxygenation: Pre-oxygenation with 100% oxygen Intubation Type: IV induction Ventilation: Mask ventilation without difficulty LMA: LMA inserted LMA Size: 3.5 Number of attempts: 1 Placement Confirmation: positive ETCO2 and breath sounds checked- equal and bilateral Tube secured with: Tape Dental Injury: Teeth and Oropharynx as per pre-operative assessment

## 2015-07-01 NOTE — Anesthesia Preprocedure Evaluation (Signed)
Anesthesia Evaluation  Patient identified by MRN, date of birth, ID band Patient awake    Reviewed: Allergy & Precautions, NPO status , Patient's Chart, lab work & pertinent test results  History of Anesthesia Complications (+) history of anesthetic complications  Airway Mallampati: II  TM Distance: >3 FB     Dental  (+) Caps, Chipped   Pulmonary neg pulmonary ROS,    Pulmonary exam normal breath sounds clear to auscultation       Cardiovascular hypertension, Pt. on medications + Peripheral Vascular Disease  Normal cardiovascular exam     Neuro/Psych negative neurological ROS  negative psych ROS   GI/Hepatic negative GI ROS, Neg liver ROS,   Endo/Other  diabetes, Well Controlled, Type 2, Oral Hypoglycemic Agents  Renal/GU negative Renal ROS  Female GU complaint     Musculoskeletal  (+) Arthritis , Osteoarthritis,    Abdominal Normal abdominal exam  (+)   Peds negative pediatric ROS (+)  Hematology  (+) anemia ,   Anesthesia Other Findings   Reproductive/Obstetrics                             Anesthesia Physical Anesthesia Plan  ASA: III  Anesthesia Plan: General   Post-op Pain Management:    Induction: Intravenous  Airway Management Planned: LMA  Additional Equipment:   Intra-op Plan:   Post-operative Plan: Extubation in OR  Informed Consent: I have reviewed the patients History and Physical, chart, labs and discussed the procedure including the risks, benefits and alternatives for the proposed anesthesia with the patient or authorized representative who has indicated his/her understanding and acceptance.   Dental advisory given  Plan Discussed with: CRNA and Surgeon  Anesthesia Plan Comments:         Anesthesia Quick Evaluation

## 2015-07-02 HISTORY — PX: BREAST BIOPSY: SHX20

## 2015-07-04 LAB — SURGICAL PATHOLOGY

## 2015-07-05 NOTE — Anesthesia Postprocedure Evaluation (Signed)
Anesthesia Post Note  Patient: Selena Contreras  Procedure(s) Performed: Procedure(s) (LRB): DILATATION & CURETTAGE/HYSTEROSCOPY WITH MYOSURE (N/A)  Patient location during evaluation: PACU Anesthesia Type: General Level of consciousness: awake and alert and oriented Pain management: pain level controlled Vital Signs Assessment: post-procedure vital signs reviewed and stable Respiratory status: spontaneous breathing Cardiovascular status: blood pressure returned to baseline Anesthetic complications: no    Last Vitals:  Filed Vitals:   07/01/15 1015 07/01/15 1046  BP: 133/66 106/41  Pulse: 72 78  Temp: 35.8 C   Resp: 16 16    Last Pain:  Filed Vitals:   07/04/15 0837  PainSc: 0-No pain                 [name]

## 2015-07-11 ENCOUNTER — Ambulatory Visit
Admission: RE | Admit: 2015-07-11 | Discharge: 2015-07-11 | Disposition: A | Discharge status: Home / Self Care | Payer: Commercial Managed Care - HMO | Source: Ambulatory Visit | Attending: General Surgery | Admitting: General Surgery

## 2015-07-11 ENCOUNTER — Other Ambulatory Visit: Payer: Self-pay | Admitting: General Surgery

## 2015-07-11 DIAGNOSIS — C50912 Malignant neoplasm of unspecified site of left female breast: Secondary | ICD-10-CM

## 2015-07-11 DIAGNOSIS — R928 Other abnormal and inconclusive findings on diagnostic imaging of breast: Secondary | ICD-10-CM | POA: Diagnosis not present

## 2015-07-11 DIAGNOSIS — Z853 Personal history of malignant neoplasm of breast: Secondary | ICD-10-CM | POA: Diagnosis not present

## 2015-07-18 ENCOUNTER — Ambulatory Visit: Payer: Commercial Managed Care - HMO | Admitting: General Surgery

## 2015-07-18 ENCOUNTER — Other Ambulatory Visit: Payer: Self-pay | Admitting: General Surgery

## 2015-07-18 ENCOUNTER — Ambulatory Visit (INDEPENDENT_AMBULATORY_CARE_PROVIDER_SITE_OTHER): Payer: Commercial Managed Care - HMO | Admitting: General Surgery

## 2015-07-18 ENCOUNTER — Encounter: Payer: Self-pay | Admitting: General Surgery

## 2015-07-18 VITALS — BP 130/76 | HR 74 | Resp 14 | Ht 63.0 in | Wt 242.0 lb

## 2015-07-18 DIAGNOSIS — R928 Other abnormal and inconclusive findings on diagnostic imaging of breast: Secondary | ICD-10-CM

## 2015-07-18 NOTE — Progress Notes (Signed)
Patient ID: Selena Contreras, female   DOB: 1946-02-24, 70 y.o.   MRN: [id number]  Chief Complaint  Patient presents with  . Follow-up    mammogram    HPI Selena Contreras is a 70 y.o. female. who presents for a breast evaluation. The most recent mammogram was done on 07/11/15 . Patient state she feel a lump in her left breast. She states the area comes and goes.  This is a 6 month follow-up of a previous abnormal tomo synthesis mammogram of the right breast.  Patient does perform regular self breast checks and gets regular mammograms done.    I personally review the patient's history.  HPI  Past Medical History  Diagnosis Date  . Ulcer     STOMACH  . Arthritis   . Diabetes mellitus without complication ([place])     AGE 70  . Neuropathy ([employer]) 2010  . S/P chemotherapy, time since greater than 12 weeks     left breast  . S/P radiation therapy > 12 wks ago     left breast cancer 2010  . Glaucoma   . Cataracts, both eyes   . Anemia   . Hollenhorst plaque, both eyes   . Complication of anesthesia     hard to put to sleep needs more  . Hypertension   . Cancer [facility]) February 24,2010    Left breast:  T1 C., N0, M0, triple negative treated with wide excision, mastoplasty and sentinel node biopsy in February 2010    Past Surgical History  Procedure Laterality Date  . Breast surgery Left 2010    LEFT BREAST WIDE EXCISION  . Sentinel lymph node biopsy Left 2010  . Toe surgery    . Colonoscopy  2012    [facility]  . Breast biopsy Left     negative 2007  . Breast excisional biopsy Left     positive 2010  . Breast cyst aspiration Right     2002  . Eye surgery      FOR GLAUCOMA  . Mastectomy      PARTIAL/LEFT LUMPECTOMY  . Dilatation & curettage/hysteroscopy with myosure N/A 07/01/2015    Procedure: DILATATION & CURETTAGE/HYSTEROSCOPY WITH MYOSURE;  Surgeon: Boykin Nearing, MD;  Location: [facility] ORS;  Service: Gynecology;  Laterality: N/A;    Family History  Problem  Relation Age of Onset  . Breast cancer Sister 36  . Breast cancer Sister 63    Social History Social History  Substance Use Topics  . Smoking status: Never Smoker   . Smokeless tobacco: Never Used  . Alcohol Use: No    Allergies  Allergen Reactions  . Flagyl [Metronidazole]   . Latex   . Lisinopril Cough  . Naprosyn [Naproxen]     UNKNOWN   . Tegaderm Ag Mesh [Silver]     UNKNOWN   . Doxycycline Other (See Comments)    fatigue per pt  . Tape Rash    PAPER OK TO USE     Current Outpatient Prescriptions  Medication Sig Dispense Refill  . acetaminophen (TYLENOL) 500 MG tablet Take 500 mg by mouth every 6 (six) hours as needed.    Marland Kitchen aspirin 81 MG tablet Take 81 mg by mouth daily.    . calcium carbonate (OS-CAL) 600 MG TABS Take 600 mg by mouth 2 (two) times daily with a meal.    . Ferrous Sulfate (IRON) 325 (65 FE) MG TABS Take 1 tablet by mouth daily.    . fish oil-omega-3  fatty acids 1000 MG capsule Take 2 g by mouth daily.    . fluticasone (FLONASE) 50 MCG/ACT nasal spray Place 2 sprays into both nostrils daily. As needed    . gabapentin (NEURONTIN) 300 MG capsule 300 mg at bedtime.     Marland Kitchen glipiZIDE (GLUCOTROL) 10 MG tablet Take 10 mg by mouth 2 (two) times daily before a meal.    . latanoprost (XALATAN) 0.005 % ophthalmic solution Place 1 drop into both eyes at bedtime.    Marland Kitchen losartan (COZAAR) 25 MG tablet Take 25 mg by mouth daily.    . metFORMIN (GLUCOPHAGE) 1000 MG tablet Take 1,000 mg by mouth 2 (two) times daily with a meal.    . Multiple Vitamin (MULTIVITAMIN) capsule Take 1 capsule by mouth daily.    . sitaGLIPtin (JANUVIA) 100 MG tablet Take 100 mg by mouth daily.    . timolol (BETIMOL) 0.25 % ophthalmic solution Place 1-2 drops into the left eye 2 (two) times daily.      No current facility-administered medications for this visit.    Review of Systems Review of Systems  Constitutional: Negative.   Respiratory: Negative.   Cardiovascular: Negative.      Blood pressure 130/76, pulse 74, resp. rate 14, height 5\' 3"  (1.6 m), weight 242 lb (109.77 kg).  Physical Exam Physical Exam  Constitutional: She is oriented to person, place, and time. She appears well-developed and well-nourished.  Eyes: Conjunctivae are normal. No scleral icterus.  Neck: Neck supple.  Cardiovascular: Normal rate, regular rhythm and normal heart sounds.   Pulmonary/Chest: Effort normal and breath sounds normal. Right breast exhibits no inverted nipple, no mass, no nipple discharge, no skin change and no tenderness. Left breast exhibits no inverted nipple, no mass, no nipple discharge, no skin change and no tenderness. Breasts are asymmetrical (left breast 1 cups size smaller than right.  ).  Left breast well healed incision at 9 o'clock.  Lymphadenopathy:    She has no cervical adenopathy.    She has no axillary adenopathy.  Neurological: She is alert and oriented to person, place, and time.  Skin: Skin is warm and dry.    Data Reviewed The 07/11/2015 mammogram was independently reviewed and a personal discussion was held with the radiologist.  Assessment    Unchanged right breast mammogram, density on total synthesis views. BI-RADS-4.    Plan    Options for management were reviewed. This is a lesion that can only be imaged on total synthesis views and will require biopsy outside the [place] if desired.  The patient reports should she has a number of stressors at this time and knowing that this area is benign will be of great benefit to her.   Patient to be scheduled for a right breast biopsy in [place].   This has been arranged for 07-27-15 arrive at 10:40 am at the [facility]. Patient is aware of date, time, and instructions.  PCP:  Paulita Cradle K This information has been scribed by Gaspar Cola CMA.   Robert Bellow 07/19/2015, 7:17 PM

## 2015-07-18 NOTE — Patient Instructions (Signed)
Patient to be scheduled for a right breast biopsy in [place].

## 2015-07-19 DIAGNOSIS — R928 Other abnormal and inconclusive findings on diagnostic imaging of breast: Secondary | ICD-10-CM | POA: Insufficient documentation

## 2015-07-19 DIAGNOSIS — R921 Mammographic calcification found on diagnostic imaging of breast: Secondary | ICD-10-CM | POA: Insufficient documentation

## 2015-07-27 ENCOUNTER — Ambulatory Visit
Admission: RE | Admit: 2015-07-27 | Discharge: 2015-07-27 | Disposition: A | Discharge status: Home / Self Care | Payer: Commercial Managed Care - HMO | Source: Ambulatory Visit | Attending: General Surgery | Admitting: General Surgery

## 2015-07-27 DIAGNOSIS — R928 Other abnormal and inconclusive findings on diagnostic imaging of breast: Secondary | ICD-10-CM

## 2015-07-27 DIAGNOSIS — N6489 Other specified disorders of breast: Secondary | ICD-10-CM | POA: Diagnosis not present

## 2015-07-27 DIAGNOSIS — N6091 Unspecified benign mammary dysplasia of right breast: Secondary | ICD-10-CM | POA: Diagnosis not present

## 2015-07-27 HISTORY — PX: BREAST BIOPSY: SHX20

## 2015-07-28 ENCOUNTER — Telehealth: Payer: Self-pay | Admitting: *Deleted

## 2015-07-28 NOTE — Telephone Encounter (Signed)
Notified patient as instructed, patient pleased. Discussed follow-up appointments pending. Aware Dr Bary Castilla will review and call her next week, patient agrees

## 2015-07-28 NOTE — Telephone Encounter (Signed)
Pathology from most recent right breast biopsy was CSL- complexing sclerosing lesion, recommend excision .

## 2015-09-09 DIAGNOSIS — Z8371 Family history of colonic polyps: Secondary | ICD-10-CM | POA: Diagnosis not present

## 2015-09-09 DIAGNOSIS — K624 Stenosis of anus and rectum: Secondary | ICD-10-CM | POA: Diagnosis not present

## 2015-09-16 DIAGNOSIS — E782 Mixed hyperlipidemia: Secondary | ICD-10-CM | POA: Diagnosis not present

## 2015-09-16 DIAGNOSIS — E1140 Type 2 diabetes mellitus with diabetic neuropathy, unspecified: Secondary | ICD-10-CM | POA: Diagnosis not present

## 2015-09-16 DIAGNOSIS — D649 Anemia, unspecified: Secondary | ICD-10-CM | POA: Diagnosis not present

## 2015-09-23 DIAGNOSIS — Z6841 Body Mass Index (BMI) 40.0 and over, adult: Secondary | ICD-10-CM | POA: Diagnosis not present

## 2015-09-23 DIAGNOSIS — C50912 Malignant neoplasm of unspecified site of left female breast: Secondary | ICD-10-CM | POA: Diagnosis not present

## 2015-09-23 DIAGNOSIS — E1140 Type 2 diabetes mellitus with diabetic neuropathy, unspecified: Secondary | ICD-10-CM | POA: Diagnosis not present

## 2015-09-23 DIAGNOSIS — E119 Type 2 diabetes mellitus without complications: Secondary | ICD-10-CM | POA: Diagnosis not present

## 2015-09-23 DIAGNOSIS — Z0100 Encounter for examination of eyes and vision without abnormal findings: Secondary | ICD-10-CM | POA: Diagnosis not present

## 2015-10-12 DIAGNOSIS — M2042 Other hammer toe(s) (acquired), left foot: Secondary | ICD-10-CM | POA: Diagnosis not present

## 2015-10-12 DIAGNOSIS — L851 Acquired keratosis [keratoderma] palmaris et plantaris: Secondary | ICD-10-CM | POA: Diagnosis not present

## 2015-10-12 DIAGNOSIS — M2041 Other hammer toe(s) (acquired), right foot: Secondary | ICD-10-CM | POA: Diagnosis not present

## 2015-10-12 DIAGNOSIS — Q6689 Other  specified congenital deformities of feet: Secondary | ICD-10-CM | POA: Diagnosis not present

## 2015-10-12 DIAGNOSIS — B351 Tinea unguium: Secondary | ICD-10-CM | POA: Diagnosis not present

## 2015-10-12 DIAGNOSIS — E1140 Type 2 diabetes mellitus with diabetic neuropathy, unspecified: Secondary | ICD-10-CM | POA: Diagnosis not present

## 2015-10-21 DIAGNOSIS — H401133 Primary open-angle glaucoma, bilateral, severe stage: Secondary | ICD-10-CM | POA: Diagnosis not present

## 2015-12-09 DIAGNOSIS — H401133 Primary open-angle glaucoma, bilateral, severe stage: Secondary | ICD-10-CM | POA: Diagnosis not present

## 2015-12-16 ENCOUNTER — Encounter: Payer: Self-pay | Admitting: *Deleted

## 2015-12-16 ENCOUNTER — Encounter
Admission: RE | Disposition: A | Discharge status: Home / Self Care | Payer: Self-pay | Source: Ambulatory Visit | Attending: Unknown Physician Specialty

## 2015-12-16 ENCOUNTER — Ambulatory Visit
Admission: RE | Admit: 2015-12-16 | Discharge: 2015-12-16 | Disposition: A | Discharge status: Home / Self Care | Payer: Commercial Managed Care - HMO | Source: Ambulatory Visit | Attending: Unknown Physician Specialty | Admitting: Unknown Physician Specialty

## 2015-12-16 ENCOUNTER — Ambulatory Visit: Payer: Commercial Managed Care - HMO | Admitting: Certified Registered Nurse Anesthetist

## 2015-12-16 DIAGNOSIS — Z8371 Family history of colonic polyps: Secondary | ICD-10-CM | POA: Insufficient documentation

## 2015-12-16 DIAGNOSIS — Z9221 Personal history of antineoplastic chemotherapy: Secondary | ICD-10-CM | POA: Insufficient documentation

## 2015-12-16 DIAGNOSIS — Z91040 Latex allergy status: Secondary | ICD-10-CM | POA: Diagnosis not present

## 2015-12-16 DIAGNOSIS — Z923 Personal history of irradiation: Secondary | ICD-10-CM | POA: Diagnosis not present

## 2015-12-16 DIAGNOSIS — M1990 Unspecified osteoarthritis, unspecified site: Secondary | ICD-10-CM | POA: Insufficient documentation

## 2015-12-16 DIAGNOSIS — Z7984 Long term (current) use of oral hypoglycemic drugs: Secondary | ICD-10-CM | POA: Insufficient documentation

## 2015-12-16 DIAGNOSIS — K635 Polyp of colon: Secondary | ICD-10-CM | POA: Diagnosis not present

## 2015-12-16 DIAGNOSIS — K621 Rectal polyp: Secondary | ICD-10-CM | POA: Diagnosis not present

## 2015-12-16 DIAGNOSIS — Z1211 Encounter for screening for malignant neoplasm of colon: Secondary | ICD-10-CM | POA: Insufficient documentation

## 2015-12-16 DIAGNOSIS — Z6841 Body Mass Index (BMI) 40.0 and over, adult: Secondary | ICD-10-CM | POA: Diagnosis not present

## 2015-12-16 DIAGNOSIS — Z8719 Personal history of other diseases of the digestive system: Secondary | ICD-10-CM | POA: Diagnosis not present

## 2015-12-16 DIAGNOSIS — E1136 Type 2 diabetes mellitus with diabetic cataract: Secondary | ICD-10-CM | POA: Insufficient documentation

## 2015-12-16 DIAGNOSIS — D125 Benign neoplasm of sigmoid colon: Secondary | ICD-10-CM | POA: Insufficient documentation

## 2015-12-16 DIAGNOSIS — Z803 Family history of malignant neoplasm of breast: Secondary | ICD-10-CM | POA: Diagnosis not present

## 2015-12-16 DIAGNOSIS — K640 First degree hemorrhoids: Secondary | ICD-10-CM | POA: Diagnosis not present

## 2015-12-16 DIAGNOSIS — Z853 Personal history of malignant neoplasm of breast: Secondary | ICD-10-CM | POA: Insufficient documentation

## 2015-12-16 DIAGNOSIS — D649 Anemia, unspecified: Secondary | ICD-10-CM | POA: Insufficient documentation

## 2015-12-16 DIAGNOSIS — Z7982 Long term (current) use of aspirin: Secondary | ICD-10-CM | POA: Insufficient documentation

## 2015-12-16 DIAGNOSIS — H409 Unspecified glaucoma: Secondary | ICD-10-CM | POA: Insufficient documentation

## 2015-12-16 DIAGNOSIS — E1140 Type 2 diabetes mellitus with diabetic neuropathy, unspecified: Secondary | ICD-10-CM | POA: Diagnosis not present

## 2015-12-16 DIAGNOSIS — I10 Essential (primary) hypertension: Secondary | ICD-10-CM | POA: Diagnosis not present

## 2015-12-16 DIAGNOSIS — D128 Benign neoplasm of rectum: Secondary | ICD-10-CM | POA: Diagnosis not present

## 2015-12-16 HISTORY — DX: Dorsalgia, unspecified: M54.9

## 2015-12-16 HISTORY — DX: Morbid (severe) obesity due to excess calories: E66.01

## 2015-12-16 HISTORY — PX: COLONOSCOPY WITH PROPOFOL: SHX5780

## 2015-12-16 LAB — GLUCOSE, CAPILLARY: Glucose-Capillary: 177 mg/dL — ABNORMAL HIGH (ref 65–99)

## 2015-12-16 SURGERY — COLONOSCOPY WITH PROPOFOL
Anesthesia: General

## 2015-12-16 MED ORDER — SODIUM CHLORIDE 0.9 % IV SOLN: INTRAVENOUS | Status: DC

## 2015-12-16 MED ORDER — SODIUM CHLORIDE 0.9 % IV SOLN
INTRAVENOUS | Status: DC
  Administered 2015-12-16: 1000 mL via INTRAVENOUS

## 2015-12-16 MED ORDER — PROPOFOL 10 MG/ML IV BOLUS
INTRAVENOUS | Status: DC | PRN
  Administered 2015-12-16 (×3): 30 mg via INTRAVENOUS
  Administered 2015-12-16 (×3): 50 mg via INTRAVENOUS
  Administered 2015-12-16 (×2): 30 mg via INTRAVENOUS

## 2015-12-16 MED ORDER — PHENYLEPHRINE HCL 10 MG/ML IJ SOLN
INTRAMUSCULAR | Status: DC | PRN
  Administered 2015-12-16: 100 ug via INTRAVENOUS

## 2015-12-16 MED ORDER — GLYCOPYRROLATE 0.2 MG/ML IJ SOLN
INTRAMUSCULAR | Status: DC | PRN
  Administered 2015-12-16: 0.1 mg via INTRAVENOUS

## 2015-12-16 NOTE — Anesthesia Preprocedure Evaluation (Signed)
Anesthesia Evaluation  Patient identified by MRN, date of birth, ID band Patient awake    Reviewed: Allergy & Precautions, H&P , NPO status , Patient's Chart, lab work & pertinent test results, reviewed documented beta blocker date and time   History of Anesthesia Complications (+) history of anesthetic complications  Airway Mallampati: II  TM Distance: >3 FB Neck ROM: full    Dental no notable dental hx. (+) Caps, Missing   Pulmonary neg pulmonary ROS,    Pulmonary exam normal breath sounds clear to auscultation       Cardiovascular Exercise Tolerance: Good hypertension, (-) angina(-) CAD, (-) Past MI, (-) Cardiac Stents and (-) CABG Normal cardiovascular exam(-) dysrhythmias (-) Valvular Problems/Murmurs Rhythm:regular Rate:Normal     Neuro/Psych negative neurological ROS  negative psych ROS   GI/Hepatic negative GI ROS, Neg liver ROS,   Endo/Other  diabetes, Oral Hypoglycemic AgentsMorbid obesity  Renal/GU negative Renal ROS  negative genitourinary   Musculoskeletal   Abdominal   Peds  Hematology  (+) Blood dyscrasia, anemia ,   Anesthesia Other Findings Past Medical History: No date: Anemia No date: Arthritis No date: Back pain February 24,2010: Cancer [facility])     Comment: Left breast:  T1 C., N0, M0, triple negative               treated with wide excision, mastoplasty and               sentinel node biopsy in February 2010 No date: Cataracts, both eyes No date: Complication of anesthesia     Comment: hard to put to sleep needs more No date: Diabetes mellitus without complication (HCC)     Comment: AGE 70 No date: Glaucoma No date: Glaucoma No date: Hollenhorst plaque, both eyes No date: Hypertension No date: Morbid obesity ([place]) 2010: Neuropathy ([place]) No date: S/P chemotherapy, time since greater than 12 w*     Comment: left breast No date: S/P radiation therapy > 12 wks ago     Comment: left  breast cancer 2010 No date: Ulcer     Comment: STOMACH   Reproductive/Obstetrics negative OB ROS                             Anesthesia Physical Anesthesia Plan  ASA: III  Anesthesia Plan: General   Post-op Pain Management:    Induction:   Airway Management Planned:   Additional Equipment:   Intra-op Plan:   Post-operative Plan:   Informed Consent: I have reviewed the patients History and Physical, chart, labs and discussed the procedure including the risks, benefits and alternatives for the proposed anesthesia with the patient or authorized representative who has indicated his/her understanding and acceptance.   Dental Advisory Given  Plan Discussed with: Anesthesiologist, CRNA and Surgeon  Anesthesia Plan Comments:         Anesthesia Quick Evaluation

## 2015-12-16 NOTE — Op Note (Signed)
[facility] Patient Name: Selena Contreras Procedure Date: 12/16/2015 7:41 AM MRN: [id number] Account #: [id number] Date of Birth: 1945/10/05 Admit Type: Outpatient Age: 70 Room: [facility] ENDO ROOM 1 Gender: Female Note Status: Finalized Procedure:            Colonoscopy Indications:          Colon cancer screening in patient at increased risk:                        Family history of 1st-degree relative with colon polyps Providers:            Manya Silvas, MD Referring MD:         Precious Bard, MD (Referring MD) Medicines:            Propofol per Anesthesia Complications:        No immediate complications. Procedure:            Pre-Anesthesia Assessment:                       - After reviewing the risks and benefits, the patient                        was deemed in satisfactory condition to undergo the                        procedure.                       After obtaining informed consent, the colonoscope was                        passed under direct vision. Throughout the procedure,                        the patient's blood pressure, pulse, and oxygen                        saturations were monitored continuously. The                        Colonoscope was introduced through the anus and                        advanced to the the cecum, identified by appendiceal                        orifice and ileocecal valve. The colonoscopy was                        performed without difficulty. The patient tolerated the                        procedure well. The quality of the bowel preparation                        was excellent. Findings:      Two sessile polyps were found in the rectum. The polyps were diminutive       in size. These polyps were removed with a jumbo cold forceps. Resection       and retrieval were complete.  Five sessile polyps were found in the sigmoid colon. The polyps were       diminutive in size. These polyps  were removed with a jumbo cold forceps.       Resection and retrieval were complete.      Internal hemorrhoids were found during endoscopy. The hemorrhoids were       small and Grade I (internal hemorrhoids that do not prolapse).      The exam was otherwise without abnormality. Impression:           - Two diminutive polyps in the rectum, removed with a                        jumbo cold forceps. Resected and retrieved.                       - Five diminutive polyps in the sigmoid colon, removed                        with a jumbo cold forceps. Resected and retrieved.                       - Internal hemorrhoids.                       - The examination was otherwise normal. Recommendation:       - Await pathology results. Manya Silvas, MD 12/16/2015 8:13:30 AM This report has been signed electronically. Number of Addenda: 0 Note Initiated On: 12/16/2015 7:41 AM Scope Withdrawal Time: 0 hours 15 minutes 9 seconds  Total Procedure Duration: 0 hours 19 minutes 38 seconds       [facility]

## 2015-12-16 NOTE — Anesthesia Postprocedure Evaluation (Signed)
Anesthesia Post Note  Patient: Selena Contreras  Procedure(s) Performed: Procedure(s) (LRB): COLONOSCOPY WITH PROPOFOL (N/A)  Patient location during evaluation: Endoscopy Anesthesia Type: General Level of consciousness: awake and alert Pain management: pain level controlled Vital Signs Assessment: post-procedure vital signs reviewed and stable Respiratory status: spontaneous breathing, nonlabored ventilation, respiratory function stable and patient connected to nasal cannula oxygen Cardiovascular status: blood pressure returned to baseline and stable Postop Assessment: no signs of nausea or vomiting Anesthetic complications: no    Last Vitals:  Vitals:   12/16/15 0835 12/16/15 0845  BP: 124/71 133/75  Pulse: 78 77  Resp: 15 19  Temp:      Last Pain:  Vitals:   12/16/15 0815  TempSrc: Tympanic  PainSc: Asleep                 Martha Clan

## 2015-12-16 NOTE — Transfer of Care (Signed)
Immediate Anesthesia Transfer of Care Note  Patient: Selena Contreras  Procedure(s) Performed: Procedure(s): COLONOSCOPY WITH PROPOFOL (N/A)  Patient Location: PACU  Anesthesia Type:General  Level of Consciousness: unresponsive  Airway & Oxygen Therapy: Patient Spontanous Breathing and Patient connected to nasal cannula oxygen  Post-op Assessment: Report given to RN and Post -op Vital signs reviewed and stable  Post vital signs: Reviewed and stable  Last Vitals:  Vitals:   12/16/15 0714 12/16/15 0815  BP: (!) 144/83   Pulse: 84   Resp: 16   Temp: 36.7 C (!) (P) 35.8 C    Last Pain:  Vitals:   12/16/15 0815  TempSrc: (P) Tympanic         Complications: No apparent anesthesia complications

## 2015-12-16 NOTE — H&P (Signed)
Primary Care Physician:  Marinda Elk, MD Primary Gastroenterologist:  Dr. Vira Agar  Pre-Procedure History & Physical: HPI:  Selena Contreras is a 70 y.o. female is here for an colonoscopy.   Past Medical History:  Diagnosis Date  . Anemia   . Arthritis   . Back pain   . Cancer [facility]) February 24,2010   Left breast:  T1 C., N0, M0, triple negative treated with wide excision, mastoplasty and sentinel node biopsy in February 2010  . Cataracts, both eyes   . Complication of anesthesia    hard to put to sleep needs more  . Diabetes mellitus without complication (HCC)    AGE 69  . Glaucoma   . Glaucoma   . Hollenhorst plaque, both eyes   . Hypertension   . Morbid obesity ([place])   . Neuropathy ([place]) 2010  . S/P chemotherapy, time since greater than 12 weeks    left breast  . S/P radiation therapy > 12 wks ago    left breast cancer 2010  . Ulcer    STOMACH    Past Surgical History:  Procedure Laterality Date  . BREAST BIOPSY Left    negative 2007  . BREAST CYST ASPIRATION Right    2002  . BREAST EXCISIONAL BIOPSY Left    positive 2010  . BREAST SURGERY Left 2010   LEFT BREAST WIDE EXCISION  . COLONOSCOPY  2012   [place]  . DILATATION & CURETTAGE/HYSTEROSCOPY WITH MYOSURE N/A 07/01/2015   Procedure: DILATATION & CURETTAGE/HYSTEROSCOPY WITH MYOSURE;  Surgeon: Boykin Nearing, MD;  Location: [facility] ORS;  Service: Gynecology;  Laterality: N/A;  . DILATION AND CURETTAGE OF UTERUS    . EYE SURGERY     FOR GLAUCOMA  . MASTECTOMY     PARTIAL/LEFT LUMPECTOMY  . SENTINEL LYMPH NODE BIOPSY Left 2010  . TOE SURGERY      Prior to Admission medications   Medication Sig Start Date End Date Taking? Authorizing Provider  fexofenadine-pseudoephedrine (ALLEGRA-D 24) 180-240 MG 24 hr tablet Take 1 tablet by mouth daily as needed.   Yes Historical Provider, MD  gabapentin (NEURONTIN) 300 MG capsule 300 mg at bedtime.  06/11/12  Yes Historical Provider, MD  losartan (COZAAR)  25 MG tablet Take 25 mg by mouth daily.   Yes Historical Provider, MD  meloxicam (MOBIC) 7.5 MG tablet Take 7.5 mg by mouth daily.   Yes Historical Provider, MD  sitaGLIPtin (JANUVIA) 100 MG tablet Take 100 mg by mouth daily.   Yes Historical Provider, MD  acetaminophen (TYLENOL) 500 MG tablet Take 500 mg by mouth every 6 (six) hours as needed.    Historical Provider, MD  aspirin 81 MG tablet Take 81 mg by mouth daily.    Historical Provider, MD  calcium carbonate (OS-CAL) 600 MG TABS Take 600 mg by mouth 2 (two) times daily with a meal.    Historical Provider, MD  Ferrous Sulfate (IRON) 325 (65 FE) MG TABS Take 1 tablet by mouth daily.    Historical Provider, MD  fish oil-omega-3 fatty acids 1000 MG capsule Take 2 g by mouth daily.    Historical Provider, MD  fluticasone (FLONASE) 50 MCG/ACT nasal spray Place 2 sprays into both nostrils daily. As needed    Historical Provider, MD  glipiZIDE (GLUCOTROL) 10 MG tablet Take 10 mg by mouth 2 (two) times daily before a meal.    Historical Provider, MD  latanoprost (XALATAN) 0.005 % ophthalmic solution Place 1 drop into both eyes at  bedtime.    Historical Provider, MD  metFORMIN (GLUCOPHAGE) 1000 MG tablet Take 1,000 mg by mouth 2 (two) times daily with a meal.    Historical Provider, MD  Multiple Vitamin (MULTIVITAMIN) capsule Take 1 capsule by mouth daily.    Historical Provider, MD  timolol (BETIMOL) 0.25 % ophthalmic solution Place 1-2 drops into the left eye 2 (two) times daily.     Historical Provider, MD    Allergies as of 10/06/2015 - Review Complete 07/18/2015  Allergen Reaction Noted  . Flagyl [metronidazole]  05/31/2012  . Latex  06/22/2015  . Lisinopril Cough 05/31/2012  . Naprosyn [naproxen]  05/31/2012  . Tegaderm ag mesh [silver]  05/31/2012  . Doxycycline Other (See Comments) 05/31/2014  . Tape Rash 05/31/2012    Family History  Problem Relation Age of Onset  . Breast cancer Sister 23  . Breast cancer Sister 79    Social  History   Social History  . Marital status: Widowed    Spouse name: N/A  . Number of children: N/A  . Years of education: N/A   Occupational History  . Not on file.   Social History Main Topics  . Smoking status: Never Smoker  . Smokeless tobacco: Never Used  . Alcohol use No  . Drug use: No  . Sexual activity: Not on file   Other Topics Concern  . Not on file   Social History Narrative  . No narrative on file    Review of Systems: See HPI, otherwise negative ROS  Physical Exam: BP (!) 144/83   Pulse 84   Temp 98 F (36.7 C) (Tympanic)   Resp 16   Ht 5\' 2"  (1.575 m)   Wt 108.9 kg (240 lb)   SpO2 100%   BMI 43.90 kg/m  General:   Alert,  pleasant and cooperative in NAD Head:  Normocephalic and atraumatic. Neck:  Supple; no masses or thyromegaly. Lungs:  Clear throughout to auscultation.    Heart:  Regular rate and rhythm. Abdomen:  Soft, nontender and nondistended. Normal bowel sounds, without guarding, and without rebound.   Neurologic:  Alert and  oriented x4;  grossly normal neurologically.  Impression/Plan: Clemencia Course is here for an colonoscopy to be performed for FH colon polyps  Risks, benefits, limitations, and alternatives regarding  colonoscopy have been reviewed with the patient.  Questions have been answered.  All parties agreeable.   Gaylyn Cheers, MD  12/16/2015, 7:25 AM

## 2015-12-16 NOTE — Addendum Note (Signed)
Addendum  created 12/16/15 1108 by Darlyne Russian, CRNA   Visit Navigator Flowsheet section accepted

## 2015-12-19 ENCOUNTER — Encounter: Payer: Self-pay | Admitting: Unknown Physician Specialty

## 2015-12-19 LAB — SURGICAL PATHOLOGY

## 2015-12-20 ENCOUNTER — Telehealth: Payer: Self-pay | Admitting: General Surgery

## 2015-12-20 ENCOUNTER — Other Ambulatory Visit: Payer: Self-pay

## 2015-12-20 DIAGNOSIS — R920 Mammographic microcalcification found on diagnostic imaging of breast: Secondary | ICD-10-CM

## 2015-12-20 DIAGNOSIS — I10 Essential (primary) hypertension: Secondary | ICD-10-CM | POA: Diagnosis not present

## 2015-12-20 DIAGNOSIS — E118 Type 2 diabetes mellitus with unspecified complications: Secondary | ICD-10-CM | POA: Diagnosis not present

## 2015-12-20 DIAGNOSIS — R8290 Unspecified abnormal findings in urine: Secondary | ICD-10-CM | POA: Diagnosis not present

## 2015-12-20 DIAGNOSIS — E785 Hyperlipidemia, unspecified: Secondary | ICD-10-CM | POA: Diagnosis not present

## 2015-12-20 NOTE — Telephone Encounter (Signed)
12-20-15 @ 11:39AM PT CAME IN AFTER RECEIVING A LETTER FROM [name] LETTING HER KNOW IT WAS TIME FOR A MAMMO.THE PT STATES THAT HER LT BR HAS SOME SORENESS PT IS NOT IN RECALLS OR DOES HER LAST OFC NOTE STATE WHEN SHE SHOULD RETURN.Selena Contreras REVIEWED THIS WITH ME.PT HAD A RT BR STERO DONE 07-27-15.WHAT ARE YOUR RECOMMENDATION FOR THIS PT?

## 2015-12-20 NOTE — Telephone Encounter (Signed)
The patient is a candidate for a right breast diagnostic mammogram at this time and bilateral diagnostic mammograms in April 2018. I'll appointment after the right breast mammogram to assess her left breast complaints would be appropriate.

## 2015-12-27 NOTE — Telephone Encounter (Signed)
12-27-15 PT CALLED WHILE I WAS @ [facility].I RETURNED HER CALL @ [phone].I READ DR BYRNETT'S MESSAGE TO HER & EXPLAINED THAT [facility] IS WAITING FOR DR BYRNETT TO SIGNED THE RT DX MAMMO ORDER.ONCE SIGNED & SCHEDULED SHE WOULD BE NOTIFIED  WITH THAT INFO & THEN WE WOULD SET HER UP AN APPT TO FOLLOW UP  HERE IN THE OFC,

## 2015-12-29 DIAGNOSIS — Z0000 Encounter for general adult medical examination without abnormal findings: Secondary | ICD-10-CM | POA: Diagnosis not present

## 2015-12-29 DIAGNOSIS — Z23 Encounter for immunization: Secondary | ICD-10-CM | POA: Diagnosis not present

## 2015-12-29 DIAGNOSIS — Z780 Asymptomatic menopausal state: Secondary | ICD-10-CM | POA: Diagnosis not present

## 2015-12-29 DIAGNOSIS — E1140 Type 2 diabetes mellitus with diabetic neuropathy, unspecified: Secondary | ICD-10-CM | POA: Diagnosis not present

## 2015-12-29 DIAGNOSIS — R079 Chest pain, unspecified: Secondary | ICD-10-CM | POA: Diagnosis not present

## 2015-12-29 DIAGNOSIS — G63 Polyneuropathy in diseases classified elsewhere: Secondary | ICD-10-CM | POA: Diagnosis not present

## 2015-12-29 DIAGNOSIS — D649 Anemia, unspecified: Secondary | ICD-10-CM | POA: Diagnosis not present

## 2015-12-29 DIAGNOSIS — Z6841 Body Mass Index (BMI) 40.0 and over, adult: Secondary | ICD-10-CM | POA: Diagnosis not present

## 2015-12-29 DIAGNOSIS — C50911 Malignant neoplasm of unspecified site of right female breast: Secondary | ICD-10-CM | POA: Diagnosis not present

## 2015-12-29 NOTE — Telephone Encounter (Signed)
Contact number given. She will schedule her mammogram and then call us back to set up her follow up appointment.

## 2015-12-29 NOTE — Telephone Encounter (Signed)
Call to patient to let her know that her mammogram orders have been signed. She will call back for the number to [facility] as she is currently driving.

## 2016-01-16 DIAGNOSIS — H6983 Other specified disorders of Eustachian tube, bilateral: Secondary | ICD-10-CM | POA: Diagnosis not present

## 2016-01-16 DIAGNOSIS — J301 Allergic rhinitis due to pollen: Secondary | ICD-10-CM | POA: Diagnosis not present

## 2016-01-17 DIAGNOSIS — Z780 Asymptomatic menopausal state: Secondary | ICD-10-CM | POA: Diagnosis not present

## 2016-01-23 ENCOUNTER — Ambulatory Visit: Payer: Commercial Managed Care - HMO | Admitting: Oncology

## 2016-01-23 ENCOUNTER — Other Ambulatory Visit: Payer: Commercial Managed Care - HMO

## 2016-01-25 ENCOUNTER — Other Ambulatory Visit: Payer: Self-pay

## 2016-01-25 DIAGNOSIS — C50919 Malignant neoplasm of unspecified site of unspecified female breast: Secondary | ICD-10-CM

## 2016-01-26 ENCOUNTER — Ambulatory Visit
Admission: RE | Admit: 2016-01-26 | Discharge: 2016-01-26 | Disposition: A | Discharge status: Home / Self Care | Payer: Commercial Managed Care - HMO | Source: Ambulatory Visit | Attending: General Surgery | Admitting: General Surgery

## 2016-01-26 ENCOUNTER — Telehealth: Payer: Self-pay | Admitting: *Deleted

## 2016-01-26 ENCOUNTER — Encounter: Payer: Self-pay | Admitting: *Deleted

## 2016-01-26 ENCOUNTER — Other Ambulatory Visit: Payer: Self-pay | Admitting: General Surgery

## 2016-01-26 DIAGNOSIS — R928 Other abnormal and inconclusive findings on diagnostic imaging of breast: Secondary | ICD-10-CM | POA: Diagnosis not present

## 2016-01-26 DIAGNOSIS — R920 Mammographic microcalcification found on diagnostic imaging of breast: Secondary | ICD-10-CM | POA: Diagnosis present

## 2016-01-26 NOTE — Telephone Encounter (Signed)
Called patient to see if she has obtained her [facility] referral. Per appt note patient was going to do this. We have not received her referral yet.

## 2016-01-30 ENCOUNTER — Encounter: Payer: Self-pay | Admitting: Internal Medicine

## 2016-01-30 ENCOUNTER — Inpatient Hospital Stay: Payer: Commercial Managed Care - HMO | Attending: Oncology | Admitting: Internal Medicine

## 2016-01-30 ENCOUNTER — Inpatient Hospital Stay (HOSPITAL_BASED_OUTPATIENT_CLINIC_OR_DEPARTMENT_OTHER): Payer: Commercial Managed Care - HMO

## 2016-01-30 DIAGNOSIS — E1122 Type 2 diabetes mellitus with diabetic chronic kidney disease: Secondary | ICD-10-CM | POA: Diagnosis not present

## 2016-01-30 DIAGNOSIS — N189 Chronic kidney disease, unspecified: Secondary | ICD-10-CM | POA: Insufficient documentation

## 2016-01-30 DIAGNOSIS — I129 Hypertensive chronic kidney disease with stage 1 through stage 4 chronic kidney disease, or unspecified chronic kidney disease: Secondary | ICD-10-CM | POA: Diagnosis not present

## 2016-01-30 DIAGNOSIS — Z923 Personal history of irradiation: Secondary | ICD-10-CM | POA: Diagnosis not present

## 2016-01-30 DIAGNOSIS — D649 Anemia, unspecified: Secondary | ICD-10-CM | POA: Diagnosis not present

## 2016-01-30 DIAGNOSIS — Z7982 Long term (current) use of aspirin: Secondary | ICD-10-CM | POA: Diagnosis not present

## 2016-01-30 DIAGNOSIS — Z79899 Other long term (current) drug therapy: Secondary | ICD-10-CM

## 2016-01-30 DIAGNOSIS — C50919 Malignant neoplasm of unspecified site of unspecified female breast: Secondary | ICD-10-CM

## 2016-01-30 DIAGNOSIS — Z171 Estrogen receptor negative status [ER-]: Secondary | ICD-10-CM | POA: Insufficient documentation

## 2016-01-30 DIAGNOSIS — Z803 Family history of malignant neoplasm of breast: Secondary | ICD-10-CM | POA: Diagnosis not present

## 2016-01-30 DIAGNOSIS — Z853 Personal history of malignant neoplasm of breast: Secondary | ICD-10-CM | POA: Insufficient documentation

## 2016-01-30 DIAGNOSIS — M1990 Unspecified osteoarthritis, unspecified site: Secondary | ICD-10-CM | POA: Insufficient documentation

## 2016-01-30 DIAGNOSIS — M7989 Other specified soft tissue disorders: Secondary | ICD-10-CM | POA: Insufficient documentation

## 2016-01-30 DIAGNOSIS — C50812 Malignant neoplasm of overlapping sites of left female breast: Secondary | ICD-10-CM | POA: Insufficient documentation

## 2016-01-30 DIAGNOSIS — Z9221 Personal history of antineoplastic chemotherapy: Secondary | ICD-10-CM | POA: Diagnosis not present

## 2016-01-30 LAB — CBC WITH DIFFERENTIAL/PLATELET
Basophils Absolute: 0.1 10*3/uL (ref 0–0.1)
Basophils Relative: 1 %
EOS ABS: 0.3 10*3/uL (ref 0–0.7)
EOS PCT: 4 %
HCT: 33.9 % — ABNORMAL LOW (ref 35.0–47.0)
HEMOGLOBIN: 10.9 g/dL — AB (ref 12.0–16.0)
LYMPHS ABS: 2.7 10*3/uL (ref 1.0–3.6)
LYMPHS PCT: 33 %
MCH: 27.3 pg (ref 26.0–34.0)
MCHC: 32 g/dL (ref 32.0–36.0)
MCV: 85.1 fL (ref 80.0–100.0)
MONOS PCT: 9 %
Monocytes Absolute: 0.7 10*3/uL (ref 0.2–0.9)
NEUTROS PCT: 53 %
Neutro Abs: 4.5 10*3/uL (ref 1.4–6.5)
Platelets: 261 10*3/uL (ref 150–440)
RBC: 3.98 MIL/uL (ref 3.80–5.20)
RDW: 14.8 % — ABNORMAL HIGH (ref 11.5–14.5)
WBC: 8.4 10*3/uL (ref 3.6–11.0)

## 2016-01-30 LAB — COMPREHENSIVE METABOLIC PANEL
ALK PHOS: 73 U/L (ref 38–126)
ALT: 16 U/L (ref 14–54)
ANION GAP: 11 (ref 5–15)
AST: 20 U/L (ref 15–41)
Albumin: 3.7 g/dL (ref 3.5–5.0)
BUN: 26 mg/dL — ABNORMAL HIGH (ref 6–20)
CALCIUM: 8.8 mg/dL — AB (ref 8.9–10.3)
CO2: 24 mmol/L (ref 22–32)
CREATININE: 1.23 mg/dL — AB (ref 0.44–1.00)
Chloride: 101 mmol/L (ref 101–111)
GFR, EST AFRICAN AMERICAN: 51 mL/min — AB (ref >60)
GFR, EST NON AFRICAN AMERICAN: 44 mL/min — AB (ref >60)
Glucose, Bld: 197 mg/dL — ABNORMAL HIGH (ref 65–99)
Potassium: 5.1 mmol/L (ref 3.5–5.1)
SODIUM: 136 mmol/L (ref 135–145)
TOTAL PROTEIN: 7.3 g/dL (ref 6.5–8.1)
Total Bilirubin: 0.5 mg/dL (ref 0.3–1.2)

## 2016-01-30 NOTE — Assessment & Plan Note (Addendum)
#   LEFT BREAST -  Stage I triple breast cancer status post lumpectomy followed by radiation. Status post adjuvant chemotherapy.  Clinically no evidence of recurrence.   # CKD- creat 1.18/ ? DM A1c 7.6-; Fasting 158 Blood glucose; recommend Follow up with PCP.   # Bil fore-arm swelling-  Unclear etiology. Does not appear lymphedema.  # Anemia- Hb 10.9/  Question secondary to CKd- continue  By mouth iron once a day.  # follow up in 1 years/cbc/ cmp.   CC: [name].

## 2016-01-30 NOTE — Progress Notes (Signed)
Patient here for follow up. No changes since last appointment.  

## 2016-01-30 NOTE — Progress Notes (Signed)
[place] OFFICE PROGRESS NOTE  Patient Care Team: Marinda Elk, MD as PCP - General (Physician Assistant) Robert Bellow, MD as Consulting Physician (General Surgery)  No matching staging information was found for the patient.   Oncology History   # FEB 2010- Left breast:  T1 C., N0, M0, triple negative treated with wide excisions/p Lumpectomy and sentinel node biopsy. [Dr.Byrnett]; s/p Adj Chemo [Dr.Yabanez]; s/p RT;   # CKD [creat 1.18/ DM-2 poorly controlled]     Breast cancer ([place])   05/26/2008 Initial Diagnosis    Breast cancer ([place])      Carcinoma of overlapping sites of left breast in female, estrogen receptor negative ([place])     This is my first interaction with the patient;  I reviewed the patient's prior charts/pertinent labs/imaging in detail; findings are summarized above.     INTERVAL HISTORY:  Selena Contreras 70 y.o.  female pleasant patient above history of  stageI breast cancer is hee for follow-up.   Patient denies any lumps or bumps. Appetite is god. N weight loss. No chest pain or shotness of breath or cog   Complans of mild swellng the bilateral forearms.  REVIEW OF SYSTEMS:  A complete 10 point review of system is done which is negative except mentioned above/history of present illness.   PAST MEDICAL HISTORY :  Past Medical History:  Diagnosis Date  . Anemia   . Arthritis   . Back pain   . Cancer [facility]) February 24,2010   Left breast:  T1 C., N0, M0, triple negative treated with wide excision, mastoplasty and sentinel node biopsy in February 2010  . Cataracts, both eyes   . Complication of anesthesia    hard to put to sleep needs more  . Diabetes mellitus without complication (HCC)    AGE 89  . Glaucoma   . Glaucoma   . Hollenhorst plaque, both eyes   . Hypertension   . Morbid obesity ([place])   . Neuropathy ([place]) 2010  . S/P chemotherapy, time since greater than 12 weeks    left breast  . S/P radiation therapy >  12 wks ago    left breast cancer 2010  . Ulcer ([place])    STOMACH    PAST SURGICAL HISTORY :   Past Surgical History:  Procedure Laterality Date  . BREAST BIOPSY Left    negative 2007  . BREAST BIOPSY Right 07/2015   complex sclerosing lesion  . BREAST CYST ASPIRATION Right    2002  . BREAST EXCISIONAL BIOPSY Left    positive 2010  . BREAST SURGERY Left 2010   LEFT BREAST WIDE EXCISION  . COLONOSCOPY  2012   [place]  . COLONOSCOPY WITH PROPOFOL N/A 12/16/2015   Procedure: COLONOSCOPY WITH PROPOFOL;  Surgeon: Manya Silvas, MD;  Location: [facility] ENDOSCOPY;  Service: Endoscopy;  Laterality: N/A;  . DILATATION & CURETTAGE/HYSTEROSCOPY WITH MYOSURE N/A 07/01/2015   Procedure: DILATATION & CURETTAGE/HYSTEROSCOPY WITH MYOSURE;  Surgeon: Boykin Nearing, MD;  Location: [facility] ORS;  Service: Gynecology;  Laterality: N/A;  . DILATION AND CURETTAGE OF UTERUS    . EYE SURGERY     FOR GLAUCOMA  . MASTECTOMY     PARTIAL/LEFT LUMPECTOMY  . SENTINEL LYMPH NODE BIOPSY Left 2010  . TOE SURGERY      FAMILY HISTORY :   Family History  Problem Relation Age of Onset  . Breast cancer Sister 20  . Breast cancer Sister 52    SOCIAL HISTORY:  Social History  Substance Use Topics  . Smoking status: Never Smoker  . Smokeless tobacco: Never Used  . Alcohol use No    ALLERGIES:  is allergic to flagyl [metronidazole]; latex; lisinopril; naprosyn [naproxen]; tegaderm ag mesh [silver]; doxycycline; and tape.  MEDICATIONS:  Current Outpatient Prescriptions  Medication Sig Dispense Refill  . acetaminophen (TYLENOL) 500 MG tablet Take 500 mg by mouth every 6 (six) hours as needed.    Marland Kitchen aspirin 81 MG tablet Take 81 mg by mouth daily.    . calcium carbonate (OS-CAL) 600 MG TABS Take 600 mg by mouth 2 (two) times daily with a meal.    . Ferrous Sulfate (IRON) 325 (65 FE) MG TABS Take 1 tablet by mouth daily.    . fexofenadine-pseudoephedrine (ALLEGRA-D 24) 180-240 MG 24 hr tablet Take 1 tablet  by mouth daily as needed.    . fish oil-omega-3 fatty acids 1000 MG capsule Take 2 g by mouth daily.    . fluticasone (FLONASE) 50 MCG/ACT nasal spray Place 2 sprays into both nostrils daily. As needed    . gabapentin (NEURONTIN) 300 MG capsule 300 mg at bedtime.     Marland Kitchen glipiZIDE (GLUCOTROL) 10 MG tablet Take 10 mg by mouth 2 (two) times daily before a meal.    . latanoprost (XALATAN) 0.005 % ophthalmic solution Place 1 drop into both eyes at bedtime.    Marland Kitchen losartan (COZAAR) 25 MG tablet Take 25 mg by mouth daily.    . metFORMIN (GLUCOPHAGE) 1000 MG tablet Take 1,000 mg by mouth 2 (two) times daily with a meal.    . Multiple Vitamin (MULTIVITAMIN) capsule Take 1 capsule by mouth daily.    . sitaGLIPtin (JANUVIA) 100 MG tablet Take 100 mg by mouth daily.    . timolol (BETIMOL) 0.25 % ophthalmic solution Place 1-2 drops into the left eye 2 (two) times daily.     . meloxicam (MOBIC) 7.5 MG tablet Take 7.5 mg by mouth daily.     No current facility-administered medications for this visit.     PHYSICAL EXAMINATION: ECOG PERFORMANCE STATUS: 0 - Asymptomatic  BP 119/74 (BP Location: Left Arm, Patient Position: Sitting)   Pulse 73   Temp 97.1 F (36.2 C) (Tympanic)   Ht 5\' 2"  (1.575 m)   Wt 248 lb (112.5 kg)   BMI 45.36 kg/m   Filed Weights   01/30/16 1137  Weight: 248 lb (112.5 kg)    GENERAL: Well-nourished well-developed; Alert, no distress and comfortable.   Alone.  EYES: no pallor or icterus OROPHARYNX: no thrush or ulceration; good dentition  NECK: supple, no masses felt LYMPH:  no palpable lymphadenopathy in the cervical, axillary or inguinal regions LUNGS: clear to auscultation and  No wheeze or crackles HEART/CVS: regular rate & rhythm and no murmurs; No lower extremity edema ABDOMEN:abdomen soft, non-tender and normal bowel sounds Musculoskeletal:no cyanosis of digits and no clubbing  PSYCH: alert & oriented x 3 with fluent speech NEURO: no focal motor/sensory  deficits SKIN:  no rashes or significant lesions Breast exam- defred.  LABORATORY DATA:  I have reviewed the data as listed    Component Value Date/Time   NA 136 01/30/2016 1110   NA 140 10/01/2011 1349   K 5.1 01/30/2016 1110   K 4.5 10/01/2011 1349   CL 101 01/30/2016 1110   CL 104 10/01/2011 1349   CO2 24 01/30/2016 1110   CO2 27 10/01/2011 1349   GLUCOSE 197 (H) 01/30/2016 1110   GLUCOSE 138 (  H) 10/01/2011 1349   BUN 26 (H) 01/30/2016 1110   BUN 16 10/01/2011 1349   CREATININE 1.23 (H) 01/30/2016 1110   CREATININE 0.89 01/06/2014 0938   CALCIUM 8.8 (L) 01/30/2016 1110   CALCIUM 10.1 10/01/2011 1349   PROT 7.3 01/30/2016 1110   PROT 7.1 01/06/2014 0938   ALBUMIN 3.7 01/30/2016 1110   ALBUMIN 3.2 (L) 01/06/2014 0938   AST 20 01/30/2016 1110   AST 18 01/06/2014 0938   ALT 16 01/30/2016 1110   ALT 24 01/06/2014 0938   ALKPHOS 73 01/30/2016 1110   ALKPHOS 84 01/06/2014 0938   BILITOT 0.5 01/30/2016 1110   BILITOT 0.3 01/06/2014 0938   GFRNONAA 44 (L) 01/30/2016 1110   GFRNONAA >60 01/06/2014 0938   GFRNONAA 60 (L) 10/01/2012 0912   GFRAA 51 (L) 01/30/2016 1110   GFRAA >60 01/06/2014 0938   GFRAA >60 10/01/2012 0912    No results found for: SPEP, UPEP  Lab Results  Component Value Date   WBC 8.4 01/30/2016   NEUTROABS 4.5 01/30/2016   HGB 10.9 (L) 01/30/2016   HCT 33.9 (L) 01/30/2016   MCV 85.1 01/30/2016   PLT 261 01/30/2016      Chemistry      Component Value Date/Time   NA 136 01/30/2016 1110   NA 140 10/01/2011 1349   K 5.1 01/30/2016 1110   K 4.5 10/01/2011 1349   CL 101 01/30/2016 1110   CL 104 10/01/2011 1349   CO2 24 01/30/2016 1110   CO2 27 10/01/2011 1349   BUN 26 (H) 01/30/2016 1110   BUN 16 10/01/2011 1349   CREATININE 1.23 (H) 01/30/2016 1110   CREATININE 0.89 01/06/2014 0938      Component Value Date/Time   CALCIUM 8.8 (L) 01/30/2016 1110   CALCIUM 10.1 10/01/2011 1349   ALKPHOS 73 01/30/2016 1110   ALKPHOS 84 01/06/2014 0938    AST 20 01/30/2016 1110   AST 18 01/06/2014 0938   ALT 16 01/30/2016 1110   ALT 24 01/06/2014 0938   BILITOT 0.5 01/30/2016 1110   BILITOT 0.3 01/06/2014 0938       RADIOGRAPHIC STUDIES: I have personally reviewed the radiological images as listed and agreed with the findings in the report. No results found.   ASSESSMENT & PLAN:  Carcinoma of overlapping sites of left breast in female, estrogen receptor negative ([place]) # LEFT BREAST -  Stage I triple breast cancer status post lumpectomy followed by radiation. Status post adjuvant chemotherapy.  Clinically no evidence of recurrence.   # CKD- creat 1.18/ ? DM A1c 7.6-; Fasting 158 Blood glucose; recommend Follow up with PCP.   # Bil fore-arm swelling-  Unclear etiology. Does not appear lymphedema.  # Anemia- Hb 10.9/  Question secondary to CKd- continue  By mouth iron once a day.  # follow up in 1 years/cbc/ cmp.   CC: Ms. Boykin Reaper.    Orders Placed This Encounter  Procedures  . CBC with Differential/Platelet    Standing Status:   Future    Standing Expiration Date:   02/05/2017  . Comprehensive metabolic panel    Standing Status:   Future    Standing Expiration Date:   02/05/2017   All questions were answered. The patient knows to call the clinic with any problems, questions or concerns.      Cammie Sickle, MD 01/30/2016 2:23 PM

## 2016-01-31 LAB — CANCER ANTIGEN 27.29: CA 27.29: 9.7 U/mL (ref 0.0–38.6)

## 2016-02-01 ENCOUNTER — Encounter: Payer: Self-pay | Admitting: General Surgery

## 2016-02-01 ENCOUNTER — Ambulatory Visit (INDEPENDENT_AMBULATORY_CARE_PROVIDER_SITE_OTHER): Payer: Commercial Managed Care - HMO | Admitting: General Surgery

## 2016-02-01 VITALS — BP 132/72 | HR 64 | Resp 14 | Ht 63.0 in | Wt 246.0 lb

## 2016-02-01 DIAGNOSIS — Z853 Personal history of malignant neoplasm of breast: Secondary | ICD-10-CM | POA: Insufficient documentation

## 2016-02-01 NOTE — Progress Notes (Signed)
Patient ID: Selena Contreras, female   DOB: Dec 24, 1945, 70 y.o.   MRN: [id number]  Chief Complaint  Patient presents with  . Follow-up    mammogram    HPI Selena Contreras is a 70 y.o. female who presents for a breast evaluation. The most recent mammogram was done on 01/26/16.  Patient does perform regular self breast checks and gets regular mammograms done.  She states the breast tenderness "soreness" ( left > right ) is has been much better over the past month.  HPI  Past Medical History:  Diagnosis Date  . Anemia   . Arthritis   . Back pain   . Cancer [facility]) February 24,2010   Left breast:  T1 C., N0, M0, triple negative treated with wide excision, mastoplasty and sentinel node biopsy in February 2010  . Cataracts, both eyes   . Complication of anesthesia    hard to put to sleep needs more  . Diabetes mellitus without complication (HCC)    AGE 14  . Glaucoma   . Glaucoma   . Hollenhorst plaque, both eyes   . Hypertension   . Morbid obesity ([place])   . Neuropathy ([place]) 2010  . S/P chemotherapy, time since greater than 12 weeks    left breast  . S/P radiation therapy > 12 wks ago    left breast cancer 2010  . Ulcer ([place])    STOMACH    Past Surgical History:  Procedure Laterality Date  . BREAST BIOPSY Left    negative 2007  . BREAST BIOPSY Right 07/2015   complex sclerosing lesion  . BREAST BIOPSY Right 07/27/2015   COMPLEX SCLEROSING LESION WITH USUAL DUCTAL HYPERPLASIA AND CALCIFICATIONS  . BREAST CYST ASPIRATION Right    2002  . BREAST EXCISIONAL BIOPSY Left    positive 2010  . BREAST SURGERY Left 2010   LEFT BREAST WIDE EXCISION  . COLONOSCOPY  2012   [place]  . COLONOSCOPY WITH PROPOFOL N/A 12/16/2015   Procedure: COLONOSCOPY WITH PROPOFOL;  Surgeon: Manya Silvas, MD;  Location: [facility] ENDOSCOPY;  Service: Endoscopy;  Laterality: N/A;  . DILATATION & CURETTAGE/HYSTEROSCOPY WITH MYOSURE N/A 07/01/2015   Procedure: DILATATION & CURETTAGE/HYSTEROSCOPY WITH  MYOSURE;  Surgeon: Boykin Nearing, MD;  Location: [facility] ORS;  Service: Gynecology;  Laterality: N/A;  . DILATION AND CURETTAGE OF UTERUS    . EYE SURGERY     FOR GLAUCOMA  . MASTECTOMY     PARTIAL/LEFT LUMPECTOMY  . SENTINEL LYMPH NODE BIOPSY Left 2010  . TOE SURGERY      Family History  Problem Relation Age of Onset  . Breast cancer Sister 23  . Breast cancer Sister 63    Social History Social History  Substance Use Topics  . Smoking status: Never Smoker  . Smokeless tobacco: Never Used  . Alcohol use No    Allergies  Allergen Reactions  . Flagyl [Metronidazole]   . Latex   . Lisinopril Cough  . Naprosyn [Naproxen]     UNKNOWN   . Tegaderm Ag Mesh [Silver]     UNKNOWN   . Doxycycline Other (See Comments)    fatigue per pt  . Tape Rash    PAPER OK TO USE     Current Outpatient Prescriptions  Medication Sig Dispense Refill  . acetaminophen (TYLENOL) 500 MG tablet Take 500 mg by mouth every 6 (six) hours as needed.    Marland Kitchen aspirin 81 MG tablet Take 81 mg by mouth daily.    Marland Kitchen  calcium carbonate (OS-CAL) 600 MG TABS Take 600 mg by mouth 2 (two) times daily with a meal.    . Ferrous Sulfate (IRON) 325 (65 FE) MG TABS Take 1 tablet by mouth daily.    . fexofenadine-pseudoephedrine (ALLEGRA-D 24) 180-240 MG 24 hr tablet Take 1 tablet by mouth daily as needed.    . fish oil-omega-3 fatty acids 1000 MG capsule Take 2 g by mouth daily.    . fluticasone (FLONASE) 50 MCG/ACT nasal spray Place 2 sprays into both nostrils daily. As needed    . gabapentin (NEURONTIN) 300 MG capsule 300 mg at bedtime.     Marland Kitchen glipiZIDE (GLUCOTROL) 10 MG tablet Take 10 mg by mouth 2 (two) times daily before a meal.    . latanoprost (XALATAN) 0.005 % ophthalmic solution Place 1 drop into both eyes at bedtime.    Marland Kitchen losartan (COZAAR) 25 MG tablet Take 25 mg by mouth daily.    . meloxicam (MOBIC) 7.5 MG tablet Take 7.5 mg by mouth as needed.     . metFORMIN (GLUCOPHAGE) 1000 MG tablet Take 1,000 mg  by mouth 2 (two) times daily with a meal.    . Multiple Vitamin (MULTIVITAMIN) capsule Take 1 capsule by mouth daily.    . sitaGLIPtin (JANUVIA) 100 MG tablet Take 100 mg by mouth daily.    . timolol (BETIMOL) 0.25 % ophthalmic solution Place 1-2 drops into the left eye 2 (two) times daily.      No current facility-administered medications for this visit.     Review of Systems Review of Systems  Constitutional: Negative.   Respiratory: Negative.   Cardiovascular: Negative.     Blood pressure 132/72, pulse 64, resp. rate 14, height 5\' 3"  (1.6 m), weight 246 lb (111.6 kg).  Physical Exam Physical Exam  Constitutional: She is oriented to person, place, and time. She appears well-developed and well-nourished.  HENT:  Head:    Mouth/Throat: Oropharynx is clear and moist.  Eyes: Conjunctivae are normal. No scleral icterus.  Neck: Neck supple.  Cardiovascular: Normal rate, regular rhythm and normal heart sounds.   Pulmonary/Chest: Effort normal and breath sounds normal. Right breast exhibits no inverted nipple, no mass, no nipple discharge, no skin change and no tenderness. Left breast exhibits no inverted nipple, no mass, no nipple discharge, no skin change and no tenderness.    Left breast well healed incision.   Lymphadenopathy:    She has no cervical adenopathy.    She has no axillary adenopathy.  Neurological: She is alert and oriented to person, place, and time.  Skin: Skin is warm.  Psychiatric: Her behavior is normal.    Data Reviewed Right breast mammogram dated 01/26/2016 was reviewed. Previously patient and headache vacuum biopsy of a radial scar in the right breast. No interval change. BI-RADS-2.  Medical oncology notes of 01/30/2016 reviewed.  Assessment    Benign breast exam, no evidence of recurrent malignancy.    Plan    Will get the patient back on annual screening mammograms in spring 2018.     Patient to have a bilateral screening mammogram follow up  in 6 months.  Dermatology: Dr. Oneta Rack evaluation of thickening of the skin along the mandible for which the patient had previously been told it was ingrown hairs with no treatment offered.    This information has been scribed by Karie Fetch RN, BSN,BC.   Robert Bellow 02/01/2016, 9:32 PM

## 2016-02-01 NOTE — Patient Instructions (Addendum)
The patient is aware to call back for any questions or concerns. Dermatology: Dr. Oneta Rack [phone]   Patient to have a bilateral screening mammogram follow up in 6 months.

## 2016-03-14 ENCOUNTER — Encounter: Payer: Self-pay | Admitting: *Deleted

## 2016-03-16 NOTE — Discharge Instructions (Signed)

## 2016-03-19 ENCOUNTER — Ambulatory Visit: Payer: Commercial Managed Care - HMO | Admitting: Anesthesiology

## 2016-03-19 ENCOUNTER — Ambulatory Visit
Admission: RE | Admit: 2016-03-19 | Discharge: 2016-03-19 | Disposition: A | Discharge status: Home / Self Care | Payer: Commercial Managed Care - HMO | Source: Ambulatory Visit | Attending: Ophthalmology | Admitting: Ophthalmology

## 2016-03-19 ENCOUNTER — Encounter
Admission: RE | Disposition: A | Discharge status: Home / Self Care | Payer: Self-pay | Source: Ambulatory Visit | Attending: Ophthalmology

## 2016-03-19 DIAGNOSIS — E1140 Type 2 diabetes mellitus with diabetic neuropathy, unspecified: Secondary | ICD-10-CM | POA: Diagnosis not present

## 2016-03-19 DIAGNOSIS — Z6841 Body Mass Index (BMI) 40.0 and over, adult: Secondary | ICD-10-CM | POA: Diagnosis not present

## 2016-03-19 DIAGNOSIS — E119 Type 2 diabetes mellitus without complications: Secondary | ICD-10-CM | POA: Insufficient documentation

## 2016-03-19 DIAGNOSIS — Z886 Allergy status to analgesic agent status: Secondary | ICD-10-CM | POA: Diagnosis not present

## 2016-03-19 DIAGNOSIS — I10 Essential (primary) hypertension: Secondary | ICD-10-CM | POA: Diagnosis not present

## 2016-03-19 DIAGNOSIS — Z888 Allergy status to other drugs, medicaments and biological substances status: Secondary | ICD-10-CM | POA: Insufficient documentation

## 2016-03-19 DIAGNOSIS — H401133 Primary open-angle glaucoma, bilateral, severe stage: Secondary | ICD-10-CM | POA: Diagnosis not present

## 2016-03-19 DIAGNOSIS — H401123 Primary open-angle glaucoma, left eye, severe stage: Secondary | ICD-10-CM | POA: Insufficient documentation

## 2016-03-19 HISTORY — PX: PHOTOCOAGULATION: SHX5303

## 2016-03-19 LAB — GLUCOSE, CAPILLARY
GLUCOSE-CAPILLARY: 133 mg/dL — AB (ref 65–99)
GLUCOSE-CAPILLARY: 125 mg/dL — AB (ref 65–99)

## 2016-03-19 SURGERY — PHOTOCOAGULATION
Anesthesia: Monitor Anesthesia Care | Site: Eye | Laterality: Left | Wound class: Clean

## 2016-03-19 MED ORDER — TETRACAINE HCL 0.5 % OP SOLN
OPHTHALMIC | Status: DC | PRN
  Administered 2016-03-19: 1 [drp] via OPHTHALMIC

## 2016-03-19 MED ORDER — ALFENTANIL 500 MCG/ML IJ INJ
INJECTION | INTRAMUSCULAR | Status: DC | PRN
  Administered 2016-03-19: 1000 ug via INTRAVENOUS

## 2016-03-19 MED ORDER — MIDAZOLAM HCL 2 MG/2ML IJ SOLN
INTRAMUSCULAR | Status: DC | PRN
  Administered 2016-03-19 (×2): .5 mg via INTRAVENOUS
  Administered 2016-03-19: 1 mg via INTRAVENOUS

## 2016-03-19 MED ORDER — LACTATED RINGERS IV SOLN: INTRAVENOUS | Status: DC

## 2016-03-19 SURGICAL SUPPLY — 3 items
DEVICE MICRO PULS P3 SGL USE (Laser) ×3 IMPLANT
GAUZE SPONGE 4X4 12PLY STRL (GAUZE/BANDAGES/DRESSINGS) ×3 IMPLANT
WATER STERILE IRR 250ML POUR (IV SOLUTION) ×3 IMPLANT

## 2016-03-19 NOTE — Op Note (Signed)
DATE OF SURGERY: 03/19/2016  PREOPERATIVE DIAGNOSES: Severe stage primary open angle glaucoma, left eye  POSTOPERATIVE DIAGNOSES: Same  PROCEDURES PERFORMED: Transscleral diode cyclophotocoagulation, left eye  SURGEON: Almon Hercules, M.D.  ANESTHESIA: MAC  COMPLICATIONS: None.  INDICATIONS FOR PROCEDURE: Selena Contreras is a 70 y.o. year-old female with uncontrolled primary open angle glaucoma. The risks and benefits of glaucoma surgery were discussed with the patient, and she consented for a diode laser surgery.  PROCEDURE IN DETAIL: The eye for surgery was verified during the time-out procedure in the operating room. A micropulse probe was applied to each hemilimbus with the following settings: 2033mW, 31.3% duty cycle, 155 seconds. The patient tolerated the procedure well and was transferred to the Post-operative Care Unit in stable condition.

## 2016-03-19 NOTE — Anesthesia Procedure Notes (Signed)
Procedure Name: MAC Performed by: Denisse Whitenack Pre-anesthesia Checklist: Patient identified, Emergency Drugs available, Suction available, Timeout performed and Patient being monitored Patient Re-evaluated:Patient Re-evaluated prior to inductionOxygen Delivery Method: Nasal cannula Placement Confirmation: positive ETCO2       

## 2016-03-19 NOTE — Anesthesia Postprocedure Evaluation (Signed)
Anesthesia Post Note  Patient: Selena Contreras  Procedure(s) Performed: Procedure(s) (LRB): PHOTOCOAGULATION (Left)  Patient location during evaluation: PACU Anesthesia Type: MAC Level of consciousness: awake and alert Pain management: pain level controlled Vital Signs Assessment: post-procedure vital signs reviewed and stable Respiratory status: spontaneous breathing, nonlabored ventilation, respiratory function stable and patient connected to nasal cannula oxygen Cardiovascular status: stable and blood pressure returned to baseline Anesthetic complications: no    Trecia Rogers

## 2016-03-19 NOTE — H&P (Signed)
H+P reviewed and is up to date, please see paper chart.  

## 2016-03-19 NOTE — Transfer of Care (Signed)
Immediate Anesthesia Transfer of Care Note  Patient: Selena Contreras  Procedure(s) Performed: Procedure(s) with comments: PHOTOCOAGULATION (Left) - Diabetic - oral meds CANNOT arrive before 8AM IVA Block  Patient Location: PACU  Anesthesia Type: MAC  Level of Consciousness: awake, alert  and patient cooperative  Airway and Oxygen Therapy: Patient Spontanous Breathing and Patient connected to supplemental oxygen  Post-op Assessment: Post-op Vital signs reviewed, Patient's Cardiovascular Status Stable, Respiratory Function Stable, Patent Airway and No signs of Nausea or vomiting  Post-op Vital Signs: Reviewed and stable  Complications: No apparent anesthesia complications

## 2016-03-19 NOTE — Anesthesia Preprocedure Evaluation (Signed)
Anesthesia Evaluation  Patient identified by MRN, date of birth, ID band Patient awake    Reviewed: Allergy & Precautions, H&P , NPO status , Patient's Chart, lab work & pertinent test results, reviewed documented beta blocker date and time   History of Anesthesia Complications (+) history of anesthetic complications  Airway Mallampati: I  TM Distance: >3 FB Neck ROM: full    Dental no notable dental hx.    Pulmonary neg pulmonary ROS,    Pulmonary exam normal breath sounds clear to auscultation       Cardiovascular Exercise Tolerance: Good hypertension, Normal cardiovascular exam Rhythm:regular Rate:Normal     Neuro/Psych negative neurological ROS  negative psych ROS   GI/Hepatic Neg liver ROS, PUD, Ulcer healed, in past   Endo/Other  diabetes, Type 2Morbid obesity  Renal/GU negative Renal ROS  negative genitourinary   Musculoskeletal   Abdominal   Peds  Hematology negative hematology ROS (+)   Anesthesia Other Findings   Reproductive/Obstetrics negative OB ROS                             Anesthesia Physical Anesthesia Plan  ASA: II  Anesthesia Plan: MAC   Post-op Pain Management:    Induction: Intravenous  Airway Management Planned: Nasal Cannula  Additional Equipment:   Intra-op Plan:   Post-operative Plan:   Informed Consent: I have reviewed the patients History and Physical, chart, labs and discussed the procedure including the risks, benefits and alternatives for the proposed anesthesia with the patient or authorized representative who has indicated his/her understanding and acceptance.   Dental Advisory Given  Plan Discussed with: CRNA  Anesthesia Plan Comments: (Pt states that it takes more medicine than usual to anesthetize her.)        Anesthesia Quick Evaluation

## 2016-03-20 ENCOUNTER — Encounter: Payer: Self-pay | Admitting: Ophthalmology

## 2016-04-05 DIAGNOSIS — M8580 Other specified disorders of bone density and structure, unspecified site: Secondary | ICD-10-CM | POA: Diagnosis not present

## 2016-04-05 DIAGNOSIS — Z6841 Body Mass Index (BMI) 40.0 and over, adult: Secondary | ICD-10-CM | POA: Diagnosis not present

## 2016-04-05 DIAGNOSIS — G63 Polyneuropathy in diseases classified elsewhere: Secondary | ICD-10-CM | POA: Diagnosis not present

## 2016-04-05 DIAGNOSIS — C50812 Malignant neoplasm of overlapping sites of left female breast: Secondary | ICD-10-CM | POA: Diagnosis not present

## 2016-04-05 DIAGNOSIS — Z171 Estrogen receptor negative status [ER-]: Secondary | ICD-10-CM | POA: Diagnosis not present

## 2016-04-05 DIAGNOSIS — E1140 Type 2 diabetes mellitus with diabetic neuropathy, unspecified: Secondary | ICD-10-CM | POA: Diagnosis not present

## 2016-04-05 DIAGNOSIS — H401130 Primary open-angle glaucoma, bilateral, stage unspecified: Secondary | ICD-10-CM | POA: Diagnosis not present

## 2016-05-17 ENCOUNTER — Other Ambulatory Visit: Payer: Self-pay

## 2016-05-17 DIAGNOSIS — Z1231 Encounter for screening mammogram for malignant neoplasm of breast: Secondary | ICD-10-CM

## 2016-05-30 DIAGNOSIS — L731 Pseudofolliculitis barbae: Secondary | ICD-10-CM | POA: Diagnosis not present

## 2016-05-30 DIAGNOSIS — D485 Neoplasm of uncertain behavior of skin: Secondary | ICD-10-CM | POA: Diagnosis not present

## 2016-06-26 DIAGNOSIS — H401132 Primary open-angle glaucoma, bilateral, moderate stage: Secondary | ICD-10-CM | POA: Diagnosis not present

## 2016-07-13 ENCOUNTER — Ambulatory Visit
Admission: RE | Admit: 2016-07-13 | Discharge: 2016-07-13 | Disposition: A | Discharge status: Home / Self Care | Payer: Commercial Managed Care - HMO | Source: Ambulatory Visit | Attending: General Surgery | Admitting: General Surgery

## 2016-07-13 DIAGNOSIS — Z1231 Encounter for screening mammogram for malignant neoplasm of breast: Secondary | ICD-10-CM | POA: Diagnosis not present

## 2016-07-16 ENCOUNTER — Encounter: Payer: Self-pay | Admitting: *Deleted

## 2016-07-19 ENCOUNTER — Encounter: Payer: Self-pay | Admitting: General Surgery

## 2016-07-19 ENCOUNTER — Ambulatory Visit (INDEPENDENT_AMBULATORY_CARE_PROVIDER_SITE_OTHER): Payer: Medicare HMO | Admitting: General Surgery

## 2016-07-19 VITALS — BP 124/68 | HR 70 | Resp 14 | Ht 62.0 in | Wt 240.0 lb

## 2016-07-19 DIAGNOSIS — Z853 Personal history of malignant neoplasm of breast: Secondary | ICD-10-CM

## 2016-07-19 NOTE — Patient Instructions (Addendum)
Patient will be asked to return to the office in one year with a bilateral screening mammogram. The patient is aware to call back for any questions or concerns. 

## 2016-07-19 NOTE — Progress Notes (Signed)
Patient ID: Selena Contreras, female   DOB: Jun 01, 1945, 72 y.o.   MRN: [id number]  Chief Complaint  Patient presents with  . Follow-up    HPI Selena Contreras is a 71 y.o. female who presents for a breast evaluation. The most recent mammogram was done on 07/13/2016.  Patient does perform regular self breast checks and gets regular mammograms done.  Pablo Lawrence (her sister) passed away February 21, 2016 from cancer.   HPI  Past Medical History:  Diagnosis Date  . Anemia   . Arthritis    hips, legs  . Back pain   . Cancer [facility]) February 24,2010   Left breast:  T1 C., N0, M0, triple negative treated with wide excision, mastoplasty and sentinel node biopsy in February 2010  . Cataracts, both eyes   . Complication of anesthesia    hard to put to sleep needs more  . Diabetes mellitus without complication (HCC)    AGE 49  . Glaucoma   . Glaucoma   . Hollenhorst plaque, both eyes   . Hypertension   . Morbid obesity ([place])   . Neuropathy 2010  . S/P chemotherapy, time since greater than 12 weeks    left breast  . S/P radiation therapy > 12 wks ago    left breast cancer 2010  . Ulcer    STOMACH    Past Surgical History:  Procedure Laterality Date  . BREAST BIOPSY Left    negative 2007  . BREAST BIOPSY Right 07/2015   complex sclerosing lesion  . BREAST BIOPSY Right 07/27/2015   COMPLEX SCLEROSING LESION WITH USUAL DUCTAL HYPERPLASIA AND CALCIFICATIONS  . BREAST CYST ASPIRATION Right    2002  . BREAST EXCISIONAL BIOPSY Left    positive 2010  . BREAST LUMPECTOMY Left    left breast cancer  . BREAST SURGERY Left 2010   LEFT BREAST WIDE EXCISION  . COLONOSCOPY  2012, 12/16/2015   [place]  . COLONOSCOPY WITH PROPOFOL N/A 12/16/2015   Procedure: COLONOSCOPY WITH PROPOFOL;  Surgeon: Manya Silvas, MD;  Location: [facility] ENDOSCOPY;  Service: Endoscopy;  Laterality: N/A;  . DILATATION & CURETTAGE/HYSTEROSCOPY WITH MYOSURE N/A 07/01/2015   Procedure: DILATATION & CURETTAGE/HYSTEROSCOPY  WITH MYOSURE;  Surgeon: Boykin Nearing, MD;  Location: [facility] ORS;  Service: Gynecology;  Laterality: N/A;  . DILATION AND CURETTAGE OF UTERUS    . EYE SURGERY     FOR GLAUCOMA  . PHOTOCOAGULATION Left 03/19/2016   Procedure: PHOTOCOAGULATION;  Surgeon: Ronnell Freshwater, MD;  Location: [place];  Service: Ophthalmology;  Laterality: Left;  Diabetic - oral meds CANNOT arrive before 8AM IVA Block  . SENTINEL LYMPH NODE BIOPSY Left 2010  . TOE SURGERY      Family History  Problem Relation Age of Onset  . Breast cancer Sister 13  . Breast cancer Sister 96  . Breast cancer Other     Social History Social History  Substance Use Topics  . Smoking status: Never Smoker  . Smokeless tobacco: Never Used  . Alcohol use No    Allergies  Allergen Reactions  . Brimonidine     Burning sensation in eyes, redness  . Dorzolamide     Burning sensation in eyes, redness  . Flagyl [Metronidazole]     Doesn't remember reaction  . Lisinopril Cough  . Naprosyn [Naproxen]     UNKNOWN   . Tegaderm Ag Mesh [Silver]     Skin irritation   . Doxycycline Other (See Comments)  fatigue per pt  . Tape Rash    Skin irritation. PAPER OK TO USE     Current Outpatient Prescriptions  Medication Sig Dispense Refill  . acetaminophen (TYLENOL) 500 MG tablet Take 500 mg by mouth every 6 (six) hours as needed.    Marland Kitchen aspirin 81 MG tablet Take 81 mg by mouth daily.    . calcium carbonate (OS-CAL) 600 MG TABS Take 600 mg by mouth 2 (two) times daily with a meal.    . Ferrous Sulfate (IRON) 325 (65 FE) MG TABS Take 1 tablet by mouth daily.    . fexofenadine-pseudoephedrine (ALLEGRA-D 24) 180-240 MG 24 hr tablet Take 1 tablet by mouth daily as needed.    . fish oil-omega-3 fatty acids 1000 MG capsule Take 2 g by mouth daily.    . fluticasone (FLONASE) 50 MCG/ACT nasal spray Place 2 sprays into both nostrils daily. As needed    . gabapentin (NEURONTIN) 300 MG capsule 300 mg at bedtime.      Marland Kitchen glipiZIDE (GLUCOTROL) 10 MG tablet Take 10 mg by mouth 2 (two) times daily before a meal.    . latanoprost (XALATAN) 0.005 % ophthalmic solution Place 1 drop into both eyes at bedtime.    Marland Kitchen losartan (COZAAR) 25 MG tablet Take 25 mg by mouth daily.    . metFORMIN (GLUCOPHAGE) 1000 MG tablet Take 1,000 mg by mouth 2 (two) times daily with a meal.    . Multiple Vitamin (MULTIVITAMIN) capsule Take 1 capsule by mouth daily.    . sitaGLIPtin (JANUVIA) 100 MG tablet Take 100 mg by mouth daily.    . timolol (BETIMOL) 0.25 % ophthalmic solution Place 1-2 drops into the left eye 2 (two) times daily.      No current facility-administered medications for this visit.     Review of Systems Review of Systems  Constitutional: Negative.   Respiratory: Negative.   Cardiovascular: Negative.     Blood pressure 124/68, pulse 70, resp. rate 14, height 5\' 2"  (1.575 m), weight 240 lb (108.9 kg).  Physical Exam Physical Exam  Constitutional: She is oriented to person, place, and time. She appears well-developed and well-nourished.  Eyes: Conjunctivae are normal. No scleral icterus.  Neck: Neck supple.  Cardiovascular: Normal rate, regular rhythm and normal heart sounds.   Pulmonary/Chest: Effort normal and breath sounds normal. Right breast exhibits no inverted nipple, no mass, no nipple discharge, no skin change and no tenderness. Left breast exhibits no inverted nipple, no mass, no nipple discharge, no skin change and no tenderness.    Lymphadenopathy:    She has no cervical adenopathy.    She has no axillary adenopathy.  Neurological: She is alert and oriented to person, place, and time.  Skin: Skin is warm and dry.    Data Reviewed Bilateral screening mammograms dated 07/13/2016 reviewed. No interval change. BI-RADS-1.  Assessment    Stable breast exam.    Plan      Patient will be asked to return to the office in one year with a bilateral screening mammogram.     HPI, Physical  Exam, Assessment and Plan have been scribed under the direction and in the presence of Hervey Ard, MD.  Gaspar Cola, CMA  I have completed the exam and reviewed the above documentation for accuracy and completeness.  I agree with the above.  Haematologist has been used and any errors in dictation or transcription are unintentional.  Hervey Ard, M.D., F.A.C.S.   Robert Bellow 07/20/2016,  2:59 PM

## 2016-08-03 DIAGNOSIS — Z6841 Body Mass Index (BMI) 40.0 and over, adult: Secondary | ICD-10-CM | POA: Diagnosis not present

## 2016-08-03 DIAGNOSIS — E1140 Type 2 diabetes mellitus with diabetic neuropathy, unspecified: Secondary | ICD-10-CM | POA: Diagnosis not present

## 2016-08-03 DIAGNOSIS — C50911 Malignant neoplasm of unspecified site of right female breast: Secondary | ICD-10-CM | POA: Diagnosis not present

## 2016-08-03 DIAGNOSIS — F4320 Adjustment disorder, unspecified: Secondary | ICD-10-CM | POA: Diagnosis not present

## 2016-08-03 DIAGNOSIS — G63 Polyneuropathy in diseases classified elsewhere: Secondary | ICD-10-CM | POA: Diagnosis not present

## 2016-08-03 DIAGNOSIS — D649 Anemia, unspecified: Secondary | ICD-10-CM | POA: Diagnosis not present

## 2016-10-26 DIAGNOSIS — H401112 Primary open-angle glaucoma, right eye, moderate stage: Secondary | ICD-10-CM | POA: Diagnosis not present

## 2016-10-26 DIAGNOSIS — H401123 Primary open-angle glaucoma, left eye, severe stage: Secondary | ICD-10-CM | POA: Diagnosis not present

## 2016-11-23 DIAGNOSIS — H401123 Primary open-angle glaucoma, left eye, severe stage: Secondary | ICD-10-CM | POA: Diagnosis not present

## 2016-11-23 DIAGNOSIS — H401112 Primary open-angle glaucoma, right eye, moderate stage: Secondary | ICD-10-CM | POA: Diagnosis not present

## 2016-11-30 DIAGNOSIS — L659 Nonscarring hair loss, unspecified: Secondary | ICD-10-CM | POA: Diagnosis not present

## 2016-12-26 DIAGNOSIS — Z0000 Encounter for general adult medical examination without abnormal findings: Secondary | ICD-10-CM | POA: Diagnosis not present

## 2016-12-26 DIAGNOSIS — D649 Anemia, unspecified: Secondary | ICD-10-CM | POA: Diagnosis not present

## 2016-12-26 DIAGNOSIS — E1140 Type 2 diabetes mellitus with diabetic neuropathy, unspecified: Secondary | ICD-10-CM | POA: Diagnosis not present

## 2017-01-02 ENCOUNTER — Other Ambulatory Visit: Payer: Self-pay | Admitting: *Deleted

## 2017-01-02 ENCOUNTER — Telehealth: Payer: Self-pay | Admitting: *Deleted

## 2017-01-02 DIAGNOSIS — Z0000 Encounter for general adult medical examination without abnormal findings: Secondary | ICD-10-CM | POA: Diagnosis not present

## 2017-01-02 DIAGNOSIS — H401123 Primary open-angle glaucoma, left eye, severe stage: Secondary | ICD-10-CM | POA: Diagnosis not present

## 2017-01-02 DIAGNOSIS — C50911 Malignant neoplasm of unspecified site of right female breast: Secondary | ICD-10-CM | POA: Diagnosis not present

## 2017-01-02 DIAGNOSIS — Z6841 Body Mass Index (BMI) 40.0 and over, adult: Secondary | ICD-10-CM | POA: Diagnosis not present

## 2017-01-02 DIAGNOSIS — Z23 Encounter for immunization: Secondary | ICD-10-CM | POA: Diagnosis not present

## 2017-01-02 DIAGNOSIS — C50812 Malignant neoplasm of overlapping sites of left female breast: Secondary | ICD-10-CM

## 2017-01-02 DIAGNOSIS — H401112 Primary open-angle glaucoma, right eye, moderate stage: Secondary | ICD-10-CM | POA: Diagnosis not present

## 2017-01-02 DIAGNOSIS — Z171 Estrogen receptor negative status [ER-]: Principal | ICD-10-CM

## 2017-01-02 DIAGNOSIS — G63 Polyneuropathy in diseases classified elsewhere: Secondary | ICD-10-CM | POA: Diagnosis not present

## 2017-01-02 DIAGNOSIS — E1140 Type 2 diabetes mellitus with diabetic neuropathy, unspecified: Secondary | ICD-10-CM | POA: Diagnosis not present

## 2017-01-02 NOTE — Telephone Encounter (Signed)
Pt's next apt with Dr. Jacinto Reap is on 10/31

## 2017-01-02 NOTE — Telephone Encounter (Signed)
md approved ca27-29 added per md order

## 2017-01-02 NOTE — Telephone Encounter (Signed)
Dr McLaughlin's office called inquiring if we have tumor marker ordered for patient, CA 27-29 is not ordered and has not been drawn since 2015. Patient asking that it be drawn. Please advise

## 2017-01-02 NOTE — Telephone Encounter (Signed)
Her tumor markers have been consistently drawn as per result notes. Last drawn 11 months ago.

## 2017-01-02 NOTE — Telephone Encounter (Signed)
Call returned to [facility] to inform of order entered

## 2017-01-25 ENCOUNTER — Inpatient Hospital Stay: Payer: Medicare HMO | Attending: Internal Medicine

## 2017-01-25 DIAGNOSIS — Z7982 Long term (current) use of aspirin: Secondary | ICD-10-CM | POA: Diagnosis not present

## 2017-01-25 DIAGNOSIS — Z9221 Personal history of antineoplastic chemotherapy: Secondary | ICD-10-CM | POA: Diagnosis not present

## 2017-01-25 DIAGNOSIS — N189 Chronic kidney disease, unspecified: Secondary | ICD-10-CM | POA: Insufficient documentation

## 2017-01-25 DIAGNOSIS — E1140 Type 2 diabetes mellitus with diabetic neuropathy, unspecified: Secondary | ICD-10-CM | POA: Diagnosis not present

## 2017-01-25 DIAGNOSIS — H409 Unspecified glaucoma: Secondary | ICD-10-CM | POA: Insufficient documentation

## 2017-01-25 DIAGNOSIS — C50812 Malignant neoplasm of overlapping sites of left female breast: Secondary | ICD-10-CM

## 2017-01-25 DIAGNOSIS — M1990 Unspecified osteoarthritis, unspecified site: Secondary | ICD-10-CM | POA: Diagnosis not present

## 2017-01-25 DIAGNOSIS — I129 Hypertensive chronic kidney disease with stage 1 through stage 4 chronic kidney disease, or unspecified chronic kidney disease: Secondary | ICD-10-CM | POA: Diagnosis not present

## 2017-01-25 DIAGNOSIS — Z853 Personal history of malignant neoplasm of breast: Secondary | ICD-10-CM | POA: Diagnosis not present

## 2017-01-25 DIAGNOSIS — Z79899 Other long term (current) drug therapy: Secondary | ICD-10-CM | POA: Insufficient documentation

## 2017-01-25 DIAGNOSIS — D649 Anemia, unspecified: Secondary | ICD-10-CM | POA: Insufficient documentation

## 2017-01-25 DIAGNOSIS — E1122 Type 2 diabetes mellitus with diabetic chronic kidney disease: Secondary | ICD-10-CM | POA: Insufficient documentation

## 2017-01-25 DIAGNOSIS — Z923 Personal history of irradiation: Secondary | ICD-10-CM | POA: Insufficient documentation

## 2017-01-25 DIAGNOSIS — Z171 Estrogen receptor negative status [ER-]: Secondary | ICD-10-CM | POA: Diagnosis not present

## 2017-01-25 DIAGNOSIS — Z803 Family history of malignant neoplasm of breast: Secondary | ICD-10-CM | POA: Diagnosis not present

## 2017-01-25 LAB — CBC WITH DIFFERENTIAL/PLATELET
BASOS PCT: 1 %
Basophils Absolute: 0.1 10*3/uL (ref 0–0.1)
EOS ABS: 0.2 10*3/uL (ref 0–0.7)
EOS PCT: 3 %
HEMATOCRIT: 35.1 % (ref 35.0–47.0)
Hemoglobin: 11.3 g/dL — ABNORMAL LOW (ref 12.0–16.0)
Lymphocytes Relative: 31 %
Lymphs Abs: 2.3 10*3/uL (ref 1.0–3.6)
MCH: 27.7 pg (ref 26.0–34.0)
MCHC: 32.1 g/dL (ref 32.0–36.0)
MCV: 86.3 fL (ref 80.0–100.0)
MONO ABS: 0.6 10*3/uL (ref 0.2–0.9)
Monocytes Relative: 8 %
NEUTROS PCT: 57 %
Neutro Abs: 4.3 10*3/uL (ref 1.4–6.5)
Platelets: 279 10*3/uL (ref 150–440)
RBC: 4.07 MIL/uL (ref 3.80–5.20)
RDW: 14.7 % — AB (ref 11.5–14.5)
WBC: 7.4 10*3/uL (ref 3.6–11.0)

## 2017-01-25 LAB — COMPREHENSIVE METABOLIC PANEL
ALBUMIN: 3.6 g/dL (ref 3.5–5.0)
ALT: 15 U/L (ref 14–54)
ANION GAP: 10 (ref 5–15)
AST: 18 U/L (ref 15–41)
Alkaline Phosphatase: 56 U/L (ref 38–126)
BUN: 18 mg/dL (ref 6–20)
CALCIUM: 9.4 mg/dL (ref 8.9–10.3)
CHLORIDE: 104 mmol/L (ref 101–111)
CO2: 22 mmol/L (ref 22–32)
Creatinine, Ser: 0.91 mg/dL (ref 0.44–1.00)
GFR calc Af Amer: >60 mL/min (ref >60)
GFR calc non Af Amer: >60 mL/min (ref >60)
GLUCOSE: 129 mg/dL — AB (ref 65–99)
POTASSIUM: 4.5 mmol/L (ref 3.5–5.1)
SODIUM: 136 mmol/L (ref 135–145)
TOTAL PROTEIN: 7.3 g/dL (ref 6.5–8.1)
Total Bilirubin: 0.5 mg/dL (ref 0.3–1.2)

## 2017-01-26 LAB — CANCER ANTIGEN 27.29: CA 27.29: 8.3 U/mL (ref 0.0–38.6)

## 2017-01-30 ENCOUNTER — Other Ambulatory Visit: Payer: Commercial Managed Care - HMO

## 2017-01-30 ENCOUNTER — Inpatient Hospital Stay (HOSPITAL_BASED_OUTPATIENT_CLINIC_OR_DEPARTMENT_OTHER): Payer: Medicare HMO | Admitting: Internal Medicine

## 2017-01-30 VITALS — BP 132/77 | HR 86 | Temp 97.8°F | Resp 20 | Ht 62.0 in | Wt 224.0 lb

## 2017-01-30 DIAGNOSIS — I129 Hypertensive chronic kidney disease with stage 1 through stage 4 chronic kidney disease, or unspecified chronic kidney disease: Secondary | ICD-10-CM | POA: Diagnosis not present

## 2017-01-30 DIAGNOSIS — E1122 Type 2 diabetes mellitus with diabetic chronic kidney disease: Secondary | ICD-10-CM | POA: Diagnosis not present

## 2017-01-30 DIAGNOSIS — N189 Chronic kidney disease, unspecified: Secondary | ICD-10-CM

## 2017-01-30 DIAGNOSIS — M1990 Unspecified osteoarthritis, unspecified site: Secondary | ICD-10-CM

## 2017-01-30 DIAGNOSIS — D649 Anemia, unspecified: Secondary | ICD-10-CM | POA: Diagnosis not present

## 2017-01-30 DIAGNOSIS — Z171 Estrogen receptor negative status [ER-]: Secondary | ICD-10-CM | POA: Diagnosis not present

## 2017-01-30 DIAGNOSIS — Z803 Family history of malignant neoplasm of breast: Secondary | ICD-10-CM

## 2017-01-30 DIAGNOSIS — Z9221 Personal history of antineoplastic chemotherapy: Secondary | ICD-10-CM | POA: Diagnosis not present

## 2017-01-30 DIAGNOSIS — Z853 Personal history of malignant neoplasm of breast: Secondary | ICD-10-CM

## 2017-01-30 DIAGNOSIS — Z79899 Other long term (current) drug therapy: Secondary | ICD-10-CM

## 2017-01-30 DIAGNOSIS — H409 Unspecified glaucoma: Secondary | ICD-10-CM

## 2017-01-30 DIAGNOSIS — Z7982 Long term (current) use of aspirin: Secondary | ICD-10-CM

## 2017-01-30 DIAGNOSIS — C50812 Malignant neoplasm of overlapping sites of left female breast: Secondary | ICD-10-CM

## 2017-01-30 DIAGNOSIS — Z923 Personal history of irradiation: Secondary | ICD-10-CM | POA: Diagnosis not present

## 2017-01-30 DIAGNOSIS — E1140 Type 2 diabetes mellitus with diabetic neuropathy, unspecified: Secondary | ICD-10-CM | POA: Diagnosis not present

## 2017-01-30 NOTE — Progress Notes (Signed)
Patient here for follow-up for breast cancer. She has no medical complaints. She has lost approximately 16 lbs-she is exercising daily and changed her eating habits. Patient requested information on adv. Directives, which was provided to patient.  RN Chaperoned provider with Breast Exam

## 2017-01-30 NOTE — Assessment & Plan Note (Addendum)
#   LEFT BREAST -  Stage I triple breast cancer status post lumpectomy followed by radiation. Status post adjuvant chemotherapy.  Clinically no evidence of recurrence. mammo-April 2018-WNL.   # Anemia- Hb 11.6- improved/stable.  # follow up in 1 years/cbc/ cmp.   CC: Selena Contreras.

## 2017-01-30 NOTE — Progress Notes (Signed)
[place] OFFICE PROGRESS NOTE  Patient Care Team: Marinda Elk, MD as PCP - General (Physician Assistant) Bary Castilla Forest Gleason, MD as Consulting Physician (General Surgery)  Cancer Staging No matching staging information was found for the patient.   Oncology History   # FEB 2010- Left breast:  T1 C., N0, M0, triple negative treated with wide excisions/p Lumpectomy and sentinel node biopsy. [Dr.Byrnett]; s/p Adj Chemo [Dr.Yabanez]; s/p RT;   # CKD [creat 1.18/ DM-2 poorly controlled]     Breast cancer ([place])   05/26/2008 Initial Diagnosis    Breast cancer ([place])      Carcinoma of overlapping sites of left breast in female, estrogen receptor negative ([place])      INTERVAL HISTORY:  Selena Contreras 71 y.o.  female pleasant patient above history of  Stage I breast cancer is here for a follow-up.   Patient denies any lumps or bumps. Appetite is good. No chest pain or shotness of breath or cough. Patient has lost weight. This is intentional. Overall she feels well.  REVIEW OF SYSTEMS:  A complete 10 point review of system is done which is negative except mentioned above/history of present illness.   PAST MEDICAL HISTORY :  Past Medical History:  Diagnosis Date  . Anemia   . Arthritis    hips, legs  . Back pain   . Cancer [facility]) February 24,2010   Left breast:  T1 C., N0, M0, triple negative treated with wide excision, mastoplasty and sentinel node biopsy in February 2010  . Cataracts, both eyes   . Complication of anesthesia    hard to put to sleep needs more  . Diabetes mellitus without complication (HCC)    AGE 5  . Glaucoma   . Glaucoma   . Hollenhorst plaque, both eyes   . Hypertension   . Morbid obesity ([place])   . Neuropathy 2010  . S/P chemotherapy, time since greater than 12 weeks    left breast  . S/P radiation therapy > 12 wks ago    left breast cancer 2010  . Ulcer    STOMACH    PAST SURGICAL HISTORY :   Past Surgical History:   Procedure Laterality Date  . BREAST BIOPSY Left    negative 2007  . BREAST BIOPSY Right 07/2015   complex sclerosing lesion  . BREAST BIOPSY Right 07/27/2015   COMPLEX SCLEROSING LESION WITH USUAL DUCTAL HYPERPLASIA AND CALCIFICATIONS  . BREAST CYST ASPIRATION Right    2002  . BREAST EXCISIONAL BIOPSY Left    positive 2010  . BREAST LUMPECTOMY Left    left breast cancer  . BREAST SURGERY Left 2010   LEFT BREAST WIDE EXCISION  . COLONOSCOPY  2012, 12/16/2015   [place]  . DILATION AND CURETTAGE OF UTERUS    . EYE SURGERY     FOR GLAUCOMA  . SENTINEL LYMPH NODE BIOPSY Left 2010  . TOE SURGERY      FAMILY HISTORY :   Family History  Problem Relation Age of Onset  . Breast cancer Sister 58  . Breast cancer Sister 3  . Breast cancer Other     SOCIAL HISTORY:   Social History   Tobacco Use  . Smoking status: Never Smoker  . Smokeless tobacco: Never Used  Substance Use Topics  . Alcohol use: No  . Drug use: No    ALLERGIES:  is allergic to brimonidine; dorzolamide; flagyl [metronidazole]; lisinopril; naprosyn [naproxen]; tegaderm ag mesh [silver]; doxycycline; and tape.  MEDICATIONS:  Current Outpatient Medications  Medication Sig Dispense Refill  . acetaminophen (TYLENOL) 500 MG tablet Take 500 mg by mouth every 6 (six) hours as needed.    Marland Kitchen aspirin 81 MG tablet Take 81 mg by mouth daily.    . calcium carbonate (OS-CAL) 600 MG TABS Take 600 mg by mouth 2 (two) times daily with a meal.    . Ferrous Sulfate (IRON) 325 (65 FE) MG TABS Take 1 tablet by mouth daily.    . fish oil-omega-3 fatty acids 1000 MG capsule Take 2 g by mouth daily.    . fluticasone (FLONASE) 50 MCG/ACT nasal spray Place 2 sprays into both nostrils daily. As needed    . glipiZIDE (GLUCOTROL) 10 MG tablet Take 10 mg by mouth 2 (two) times daily before a meal.    . latanoprost (XALATAN) 0.005 % ophthalmic solution Place 1 drop into both eyes at bedtime.    Marland Kitchen losartan (COZAAR) 25 MG tablet Take 25 mg  by mouth daily.    . Multiple Vitamin (MULTIVITAMIN) capsule Take 1 capsule by mouth daily.    . sitaGLIPtin (JANUVIA) 100 MG tablet Take 100 mg by mouth 2 (two) times daily.     . timolol (BETIMOL) 0.25 % ophthalmic solution Place 1-2 drops into the left eye 2 (two) times daily.     . fexofenadine-pseudoephedrine (ALLEGRA-D 24) 180-240 MG 24 hr tablet Take 1 tablet by mouth daily as needed.     No current facility-administered medications for this visit.     PHYSICAL EXAMINATION: ECOG PERFORMANCE STATUS: 0 - Asymptomatic  BP 132/77 (Patient Position: Sitting)   Pulse 86   Temp 97.8 F (36.6 C) (Tympanic)   Resp 20   Ht 5\' 2"  (1.575 m)   Wt 224 lb (101.6 kg)   BMI 40.97 kg/m   Filed Weights   01/30/17 1051  Weight: 224 lb (101.6 kg)    GENERAL: Well-nourished well-developed; Alert, no distress and comfortable.   Alone.  EYES: no pallor or icterus OROPHARYNX: no thrush or ulceration; good dentition  NECK: supple, no masses felt LYMPH:  no palpable lymphadenopathy in the cervical, axillary or inguinal regions LUNGS: clear to auscultation and  No wheeze or crackles HEART/CVS: regular rate & rhythm and no murmurs; No lower extremity edema ABDOMEN:abdomen soft, non-tender and normal bowel sounds Musculoskeletal:no cyanosis of digits and no clubbing  PSYCH: alert & oriented x 3 with fluent speech NEURO: no focal motor/sensory deficits SKIN:  no rashes or significant lesions Breast exam- defred.  LABORATORY DATA:  I have reviewed the data as listed    Component Value Date/Time   NA 136 01/25/2017 0856   NA 140 10/01/2011 1349   K 4.5 01/25/2017 0856   K 4.5 10/01/2011 1349   CL 104 01/25/2017 0856   CL 104 10/01/2011 1349   CO2 22 01/25/2017 0856   CO2 27 10/01/2011 1349   GLUCOSE 129 (H) 01/25/2017 0856   GLUCOSE 138 (H) 10/01/2011 1349   BUN 18 01/25/2017 0856   BUN 16 10/01/2011 1349   CREATININE 0.91 01/25/2017 0856   CREATININE 0.89 01/06/2014 0938   CALCIUM  9.4 01/25/2017 0856   CALCIUM 10.1 10/01/2011 1349   PROT 7.3 01/25/2017 0856   PROT 7.1 01/06/2014 0938   ALBUMIN 3.6 01/25/2017 0856   ALBUMIN 3.2 (L) 01/06/2014 0938   AST 18 01/25/2017 0856   AST 18 01/06/2014 0938   ALT 15 01/25/2017 0856   ALT 24 01/06/2014 0938   ALKPHOS  56 01/25/2017 0856   ALKPHOS 84 01/06/2014 0938   BILITOT 0.5 01/25/2017 0856   BILITOT 0.3 01/06/2014 0938   GFRNONAA >60 01/25/2017 0856   GFRNONAA >60 01/06/2014 0938   GFRNONAA 60 (L) 10/01/2012 0912   GFRAA >60 01/25/2017 0856   GFRAA >60 01/06/2014 0938   GFRAA >60 10/01/2012 0912    No results found for: SPEP, UPEP  Lab Results  Component Value Date   WBC 7.4 01/25/2017   NEUTROABS 4.3 01/25/2017   HGB 11.3 (L) 01/25/2017   HCT 35.1 01/25/2017   MCV 86.3 01/25/2017   PLT 279 01/25/2017      Chemistry      Component Value Date/Time   NA 136 01/25/2017 0856   NA 140 10/01/2011 1349   K 4.5 01/25/2017 0856   K 4.5 10/01/2011 1349   CL 104 01/25/2017 0856   CL 104 10/01/2011 1349   CO2 22 01/25/2017 0856   CO2 27 10/01/2011 1349   BUN 18 01/25/2017 0856   BUN 16 10/01/2011 1349   CREATININE 0.91 01/25/2017 0856   CREATININE 0.89 01/06/2014 0938      Component Value Date/Time   CALCIUM 9.4 01/25/2017 0856   CALCIUM 10.1 10/01/2011 1349   ALKPHOS 56 01/25/2017 0856   ALKPHOS 84 01/06/2014 0938   AST 18 01/25/2017 0856   AST 18 01/06/2014 0938   ALT 15 01/25/2017 0856   ALT 24 01/06/2014 0938   BILITOT 0.5 01/25/2017 0856   BILITOT 0.3 01/06/2014 0938       RADIOGRAPHIC STUDIES: I have personally reviewed the radiological images as listed and agreed with the findings in the report. No results found.   ASSESSMENT & PLAN:  Carcinoma of overlapping sites of left breast in female, estrogen receptor negative ([place]) # LEFT BREAST -  Stage I triple breast cancer status post lumpectomy followed by radiation. Status post adjuvant chemotherapy.  Clinically no evidence of recurrence.  mammo-April 2018-WNL.   # Anemia- Hb 11.6- improved/stable.  # follow up in 1 years/cbc/ cmp.   CC: [name].    Orders Placed This Encounter  Procedures  . CBC with Differential    Standing Status:   Future    Standing Expiration Date:   07/31/2018  . Comprehensive metabolic panel    Standing Status:   Future    Standing Expiration Date:   07/31/2018  . Cancer antigen 27.29    Standing Status:   Future    Standing Expiration Date:   07/31/2018   All questions were answered. The patient knows to call the clinic with any problems, questions or concerns.      Cammie Sickle, MD 02/03/2017 6:03 PM

## 2017-04-05 DIAGNOSIS — H401123 Primary open-angle glaucoma, left eye, severe stage: Secondary | ICD-10-CM | POA: Diagnosis not present

## 2017-04-05 DIAGNOSIS — H401112 Primary open-angle glaucoma, right eye, moderate stage: Secondary | ICD-10-CM | POA: Diagnosis not present

## 2017-04-29 DIAGNOSIS — C50911 Malignant neoplasm of unspecified site of right female breast: Secondary | ICD-10-CM | POA: Diagnosis not present

## 2017-04-29 DIAGNOSIS — E1140 Type 2 diabetes mellitus with diabetic neuropathy, unspecified: Secondary | ICD-10-CM | POA: Diagnosis not present

## 2017-05-06 DIAGNOSIS — G63 Polyneuropathy in diseases classified elsewhere: Secondary | ICD-10-CM | POA: Diagnosis not present

## 2017-05-06 DIAGNOSIS — Z6841 Body Mass Index (BMI) 40.0 and over, adult: Secondary | ICD-10-CM | POA: Diagnosis not present

## 2017-05-06 DIAGNOSIS — D649 Anemia, unspecified: Secondary | ICD-10-CM | POA: Diagnosis not present

## 2017-05-06 DIAGNOSIS — E1140 Type 2 diabetes mellitus with diabetic neuropathy, unspecified: Secondary | ICD-10-CM | POA: Diagnosis not present

## 2017-05-06 DIAGNOSIS — C50911 Malignant neoplasm of unspecified site of right female breast: Secondary | ICD-10-CM | POA: Diagnosis not present

## 2017-06-03 ENCOUNTER — Other Ambulatory Visit: Payer: Self-pay

## 2017-06-03 DIAGNOSIS — Z1231 Encounter for screening mammogram for malignant neoplasm of breast: Secondary | ICD-10-CM

## 2017-07-01 DIAGNOSIS — H9193 Unspecified hearing loss, bilateral: Secondary | ICD-10-CM | POA: Diagnosis not present

## 2017-07-01 DIAGNOSIS — H9313 Tinnitus, bilateral: Secondary | ICD-10-CM | POA: Diagnosis not present

## 2017-07-22 ENCOUNTER — Ambulatory Visit
Admission: RE | Admit: 2017-07-22 | Discharge: 2017-07-22 | Disposition: A | Discharge status: Home / Self Care | Payer: Medicare HMO | Source: Ambulatory Visit | Attending: General Surgery | Admitting: General Surgery

## 2017-07-22 ENCOUNTER — Other Ambulatory Visit: Payer: Self-pay | Admitting: General Surgery

## 2017-07-22 DIAGNOSIS — Z1231 Encounter for screening mammogram for malignant neoplasm of breast: Secondary | ICD-10-CM | POA: Diagnosis not present

## 2017-07-22 DIAGNOSIS — R928 Other abnormal and inconclusive findings on diagnostic imaging of breast: Secondary | ICD-10-CM | POA: Insufficient documentation

## 2017-07-22 DIAGNOSIS — H7440 Polyp of middle ear, unspecified ear: Secondary | ICD-10-CM | POA: Diagnosis not present

## 2017-07-22 DIAGNOSIS — H9312 Tinnitus, left ear: Secondary | ICD-10-CM | POA: Diagnosis not present

## 2017-07-22 DIAGNOSIS — H903 Sensorineural hearing loss, bilateral: Secondary | ICD-10-CM | POA: Diagnosis not present

## 2017-07-23 ENCOUNTER — Ambulatory Visit: Payer: Medicare HMO | Admitting: General Surgery

## 2017-08-06 ENCOUNTER — Ambulatory Visit: Payer: Medicare HMO | Admitting: General Surgery

## 2017-08-06 ENCOUNTER — Encounter: Payer: Self-pay | Admitting: General Surgery

## 2017-08-06 VITALS — BP 118/60 | HR 75 | Resp 11 | Ht 62.0 in | Wt 207.0 lb

## 2017-08-06 DIAGNOSIS — Z853 Personal history of malignant neoplasm of breast: Secondary | ICD-10-CM | POA: Diagnosis not present

## 2017-08-06 NOTE — Progress Notes (Signed)
Patient ID: Selena Contreras, female   DOB: 09/27/1945, 72 y.o.   MRN: [id number]  Chief Complaint  Patient presents with  . Follow-up    HPI Selena Contreras is a 71 y.o. female.  who presents for her follow up left breast cancer and a breast evaluation. The most recent mammogram was done on 07-22-17.  Patient does perform regular self breast checks and gets regular mammograms done.  No new breast issues. She does admit to losing weight, cutting back on carbs and sugar.  HPI  Past Medical History:  Diagnosis Date  . Anemia   . Arthritis    hips, legs  . Back pain   . Cancer [facility]) February 24,2010   Left breast:  T1 C., N0, M0, triple negative treated with wide excision, mastoplasty and sentinel node biopsy in February 2010  . Cataracts, both eyes   . Complication of anesthesia    hard to put to sleep needs more  . Diabetes mellitus without complication (HCC)    AGE 89  . Glaucoma   . Glaucoma   . Hollenhorst plaque, both eyes   . Hypertension   . Morbid obesity ([place])   . Neuropathy 2010  . S/P chemotherapy, time since greater than 12 weeks    left breast  . S/P radiation therapy > 12 wks ago    left breast cancer 2010  . Ulcer    STOMACH    Past Surgical History:  Procedure Laterality Date  . BREAST BIOPSY Left    negative 2007  . BREAST BIOPSY Right 07/2015   complex sclerosing lesion  . BREAST BIOPSY Right 07/27/2015   COMPLEX SCLEROSING LESION WITH USUAL DUCTAL HYPERPLASIA AND CALCIFICATIONS  . BREAST CYST ASPIRATION Right    2002  . BREAST EXCISIONAL BIOPSY Left    positive 2010  . BREAST LUMPECTOMY Left    left breast cancer  . BREAST SURGERY Left 2010   LEFT BREAST WIDE EXCISION  . COLONOSCOPY  2012, 12/16/2015   [place]  . COLONOSCOPY WITH PROPOFOL N/A 12/16/2015   Procedure: COLONOSCOPY WITH PROPOFOL;  Surgeon: Manya Silvas, MD;  Location: [facility] ENDOSCOPY;  Service: Endoscopy;  Laterality: N/A;  . DILATATION & CURETTAGE/HYSTEROSCOPY WITH MYOSURE  N/A 07/01/2015   Procedure: DILATATION & CURETTAGE/HYSTEROSCOPY WITH MYOSURE;  Surgeon: Boykin Nearing, MD;  Location: [facility] ORS;  Service: Gynecology;  Laterality: N/A;  . DILATION AND CURETTAGE OF UTERUS    . EYE SURGERY     FOR GLAUCOMA  . PHOTOCOAGULATION Left 03/19/2016   Procedure: PHOTOCOAGULATION;  Surgeon: Ronnell Freshwater, MD;  Location: [place];  Service: Ophthalmology;  Laterality: Left;  Diabetic - oral meds CANNOT arrive before 8AM IVA Block  . SENTINEL LYMPH NODE BIOPSY Left 2010  . TOE SURGERY      Family History  Problem Relation Age of Onset  . Breast cancer Sister 19  . Breast cancer Sister 20  . Breast cancer Other     Social History Social History   Tobacco Use  . Smoking status: Never Smoker  . Smokeless tobacco: Never Used  Substance Use Topics  . Alcohol use: No  . Drug use: No    Allergies  Allergen Reactions  . Brimonidine     Burning sensation in eyes, redness  . Dorzolamide     Burning sensation in eyes, redness  . Flagyl [Metronidazole]     Doesn't remember reaction  . Lisinopril Cough  . Naprosyn [Naproxen]     UNKNOWN   .  Tegaderm Ag Mesh [Silver]     Skin irritation   . Doxycycline Other (See Comments)    fatigue per pt  . Tape Rash    Skin irritation. PAPER OK TO USE     Current Outpatient Medications  Medication Sig Dispense Refill  . acetaminophen (TYLENOL) 500 MG tablet Take 500 mg by mouth every 6 (six) hours as needed.    Marland Kitchen aspirin 81 MG tablet Take 81 mg by mouth daily.    . calcium carbonate (OS-CAL) 600 MG TABS Take 600 mg by mouth 2 (two) times daily with a meal.    . Ferrous Sulfate (IRON) 325 (65 FE) MG TABS Take 1 tablet by mouth daily.    . fexofenadine-pseudoephedrine (ALLEGRA-D 24) 180-240 MG 24 hr tablet Take 1 tablet by mouth daily as needed.    . fish oil-omega-3 fatty acids 1000 MG capsule Take 2 g by mouth daily.    . fluticasone (FLONASE) 50 MCG/ACT nasal spray Place 2 sprays  into both nostrils daily. As needed    . glipiZIDE (GLUCOTROL) 10 MG tablet Take 10 mg by mouth 2 (two) times daily before a meal.    . latanoprost (XALATAN) 0.005 % ophthalmic solution Place 1 drop into both eyes at bedtime.    Marland Kitchen losartan (COZAAR) 25 MG tablet Take 25 mg by mouth daily.    . Multiple Vitamin (MULTIVITAMIN) capsule Take 1 capsule by mouth daily.    . sitaGLIPtin-metformin (JANUMET) 50-1000 MG tablet Take 1 tablet by mouth 2 (two) times daily with a meal.    . timolol (BETIMOL) 0.25 % ophthalmic solution Place 1-2 drops into the left eye 2 (two) times daily.      No current facility-administered medications for this visit.     Review of Systems Review of Systems  Constitutional: Negative.   Respiratory: Negative.   Cardiovascular: Negative.     Blood pressure 118/60, pulse 75, resp. rate 11, height 5\' 2"  (1.575 m), weight 207 lb (93.9 kg), SpO2 98 %.  Physical Exam Physical Exam  Constitutional: She is oriented to person, place, and time. She appears well-developed and well-nourished.  HENT:  Mouth/Throat: Oropharynx is clear and moist.  Eyes: Conjunctivae are normal. No scleral icterus.  Neck: Neck supple.  Cardiovascular: Normal rate, regular rhythm and normal heart sounds.  Pulmonary/Chest: Effort normal and breath sounds normal. Right breast exhibits no inverted nipple, no mass, no nipple discharge, no skin change and no tenderness. Left breast exhibits no inverted nipple, no mass, no nipple discharge, no skin change and no tenderness.  Left lumpectomy site well healed      Lymphadenopathy:    She has no cervical adenopathy.    She has no axillary adenopathy.  Neurological: She is alert and oriented to person, place, and time.  Skin: Skin is warm and dry.  Psychiatric: Her behavior is normal.    Data Reviewed Bilateral mammograms dated July 22, 2017 showed ongoing resolution of changes in the right breast where she underwent biopsy of a complex sclerosing  lesion without atypia.  BI-RADS-1.  Assessment    Benign breast exam.  Marked weight loss and response to dietary modification.  Breast asymmetry.    Plan    The right breast is about 2 cup sizes larger than the left.  She is been encouraged to have a formal bra fitting either at a mastectomy supply house or even the local department store to get a better fit.  If a prescription is needed it will  be provided.    Patient will be asked to return to the office in one year with a bilateral screening mammogram.      HPI, Physical Exam, Assessment and Plan have been scribed under the direction and in the presence of Robert Bellow, MD. Selena Fetch, RN  I have completed the exam and reviewed the above documentation for accuracy and completeness.  I agree with the above.  Haematologist has been used and any errors in dictation or transcription are unintentional.  Hervey Ard, M.D., F.A.C.S. Forest Gleason  08/06/2017, 1:11 PM

## 2017-08-06 NOTE — Patient Instructions (Addendum)
The patient is aware to call back for any questions or new concerns. Patient will be asked to return to the office in one year with a bilateral screening mammogram.  

## 2017-08-23 DIAGNOSIS — H401112 Primary open-angle glaucoma, right eye, moderate stage: Secondary | ICD-10-CM | POA: Diagnosis not present

## 2017-08-23 DIAGNOSIS — H401123 Primary open-angle glaucoma, left eye, severe stage: Secondary | ICD-10-CM | POA: Diagnosis not present

## 2017-08-27 DIAGNOSIS — E1140 Type 2 diabetes mellitus with diabetic neuropathy, unspecified: Secondary | ICD-10-CM | POA: Diagnosis not present

## 2017-08-27 DIAGNOSIS — Z6841 Body Mass Index (BMI) 40.0 and over, adult: Secondary | ICD-10-CM | POA: Diagnosis not present

## 2017-08-27 DIAGNOSIS — D649 Anemia, unspecified: Secondary | ICD-10-CM | POA: Diagnosis not present

## 2017-09-03 DIAGNOSIS — Z171 Estrogen receptor negative status [ER-]: Secondary | ICD-10-CM | POA: Diagnosis not present

## 2017-09-03 DIAGNOSIS — C50812 Malignant neoplasm of overlapping sites of left female breast: Secondary | ICD-10-CM | POA: Diagnosis not present

## 2017-09-03 DIAGNOSIS — G63 Polyneuropathy in diseases classified elsewhere: Secondary | ICD-10-CM | POA: Diagnosis not present

## 2017-09-03 DIAGNOSIS — Z6841 Body Mass Index (BMI) 40.0 and over, adult: Secondary | ICD-10-CM | POA: Diagnosis not present

## 2017-09-03 DIAGNOSIS — E1140 Type 2 diabetes mellitus with diabetic neuropathy, unspecified: Secondary | ICD-10-CM | POA: Diagnosis not present

## 2017-09-03 DIAGNOSIS — D649 Anemia, unspecified: Secondary | ICD-10-CM | POA: Diagnosis not present

## 2017-09-17 DIAGNOSIS — L0291 Cutaneous abscess, unspecified: Secondary | ICD-10-CM | POA: Diagnosis not present

## 2017-09-26 DIAGNOSIS — Z1211 Encounter for screening for malignant neoplasm of colon: Secondary | ICD-10-CM | POA: Diagnosis not present

## 2017-09-26 DIAGNOSIS — D649 Anemia, unspecified: Secondary | ICD-10-CM | POA: Diagnosis not present

## 2017-10-01 DIAGNOSIS — L680 Hirsutism: Secondary | ICD-10-CM | POA: Diagnosis not present

## 2017-10-01 DIAGNOSIS — L818 Other specified disorders of pigmentation: Secondary | ICD-10-CM | POA: Diagnosis not present

## 2017-10-14 ENCOUNTER — Other Ambulatory Visit: Payer: Self-pay | Admitting: Otolaryngology

## 2017-10-14 DIAGNOSIS — H7440 Polyp of middle ear, unspecified ear: Secondary | ICD-10-CM | POA: Diagnosis not present

## 2017-10-14 DIAGNOSIS — H903 Sensorineural hearing loss, bilateral: Secondary | ICD-10-CM | POA: Diagnosis not present

## 2017-10-14 DIAGNOSIS — H9319 Tinnitus, unspecified ear: Secondary | ICD-10-CM | POA: Diagnosis not present

## 2017-10-21 ENCOUNTER — Ambulatory Visit: Payer: Medicare HMO

## 2017-11-09 DIAGNOSIS — L680 Hirsutism: Secondary | ICD-10-CM | POA: Diagnosis not present

## 2017-11-09 DIAGNOSIS — L0202 Furuncle of face: Secondary | ICD-10-CM | POA: Diagnosis not present

## 2017-12-17 ENCOUNTER — Telehealth: Payer: Self-pay | Admitting: *Deleted

## 2017-12-17 NOTE — Telephone Encounter (Signed)
-----   Message from Wallene Dales sent at 12/17/2017  9:04 AM EDT ----- Regarding: Cancer Markers?? Pt wants to make sure this lab is being drawn when she comes in on 01/31/18. She would like a phone call to make sure.  Thank you

## 2017-12-17 NOTE — Telephone Encounter (Signed)
Call returned and confirmed with the patient that the tumor marker is scheduled to be drawn. She thanked me for returning her phone call.

## 2017-12-27 DIAGNOSIS — E1140 Type 2 diabetes mellitus with diabetic neuropathy, unspecified: Secondary | ICD-10-CM | POA: Diagnosis not present

## 2017-12-27 DIAGNOSIS — D649 Anemia, unspecified: Secondary | ICD-10-CM | POA: Diagnosis not present

## 2017-12-27 DIAGNOSIS — Z6841 Body Mass Index (BMI) 40.0 and over, adult: Secondary | ICD-10-CM | POA: Diagnosis not present

## 2018-01-03 DIAGNOSIS — L680 Hirsutism: Secondary | ICD-10-CM | POA: Diagnosis not present

## 2018-01-03 DIAGNOSIS — H9193 Unspecified hearing loss, bilateral: Secondary | ICD-10-CM | POA: Diagnosis not present

## 2018-01-03 DIAGNOSIS — D649 Anemia, unspecified: Secondary | ICD-10-CM | POA: Diagnosis not present

## 2018-01-03 DIAGNOSIS — Z171 Estrogen receptor negative status [ER-]: Secondary | ICD-10-CM | POA: Diagnosis not present

## 2018-01-03 DIAGNOSIS — Z23 Encounter for immunization: Secondary | ICD-10-CM | POA: Diagnosis not present

## 2018-01-03 DIAGNOSIS — E1140 Type 2 diabetes mellitus with diabetic neuropathy, unspecified: Secondary | ICD-10-CM | POA: Diagnosis not present

## 2018-01-03 DIAGNOSIS — C50812 Malignant neoplasm of overlapping sites of left female breast: Secondary | ICD-10-CM | POA: Diagnosis not present

## 2018-01-03 DIAGNOSIS — Z0000 Encounter for general adult medical examination without abnormal findings: Secondary | ICD-10-CM | POA: Diagnosis not present

## 2018-01-31 ENCOUNTER — Inpatient Hospital Stay: Payer: PRIVATE HEALTH INSURANCE | Attending: Internal Medicine

## 2018-01-31 DIAGNOSIS — Z9221 Personal history of antineoplastic chemotherapy: Secondary | ICD-10-CM | POA: Diagnosis not present

## 2018-01-31 DIAGNOSIS — C50812 Malignant neoplasm of overlapping sites of left female breast: Secondary | ICD-10-CM | POA: Diagnosis not present

## 2018-01-31 DIAGNOSIS — Z7984 Long term (current) use of oral hypoglycemic drugs: Secondary | ICD-10-CM | POA: Insufficient documentation

## 2018-01-31 DIAGNOSIS — D649 Anemia, unspecified: Secondary | ICD-10-CM | POA: Diagnosis not present

## 2018-01-31 DIAGNOSIS — I10 Essential (primary) hypertension: Secondary | ICD-10-CM | POA: Insufficient documentation

## 2018-01-31 DIAGNOSIS — E119 Type 2 diabetes mellitus without complications: Secondary | ICD-10-CM | POA: Diagnosis not present

## 2018-01-31 DIAGNOSIS — Z79899 Other long term (current) drug therapy: Secondary | ICD-10-CM | POA: Diagnosis not present

## 2018-01-31 DIAGNOSIS — Z171 Estrogen receptor negative status [ER-]: Secondary | ICD-10-CM | POA: Insufficient documentation

## 2018-01-31 DIAGNOSIS — Z7982 Long term (current) use of aspirin: Secondary | ICD-10-CM | POA: Diagnosis not present

## 2018-01-31 DIAGNOSIS — M8580 Other specified disorders of bone density and structure, unspecified site: Secondary | ICD-10-CM | POA: Insufficient documentation

## 2018-01-31 LAB — COMPREHENSIVE METABOLIC PANEL
ALT: 16 U/L (ref 0–44)
ANION GAP: 9 (ref 5–15)
AST: 19 U/L (ref 15–41)
Albumin: 3.6 g/dL (ref 3.5–5.0)
Alkaline Phosphatase: 66 U/L (ref 38–126)
BUN: 19 mg/dL (ref 8–23)
CHLORIDE: 102 mmol/L (ref 98–111)
CO2: 25 mmol/L (ref 22–32)
CREATININE: 0.94 mg/dL (ref 0.44–1.00)
Calcium: 8.6 mg/dL — ABNORMAL LOW (ref 8.9–10.3)
GFR, EST NON AFRICAN AMERICAN: 60 mL/min — AB (ref >60)
Glucose, Bld: 138 mg/dL — ABNORMAL HIGH (ref 70–99)
POTASSIUM: 4.6 mmol/L (ref 3.5–5.1)
Sodium: 136 mmol/L (ref 135–145)
Total Bilirubin: 0.6 mg/dL (ref 0.3–1.2)
Total Protein: 7.1 g/dL (ref 6.5–8.1)

## 2018-01-31 LAB — CBC WITH DIFFERENTIAL/PLATELET
Abs Immature Granulocytes: 0.01 10*3/uL (ref 0.00–0.07)
BASOS ABS: 0 10*3/uL (ref 0.0–0.1)
Basophils Relative: 1 %
EOS PCT: 6 %
Eosinophils Absolute: 0.3 10*3/uL (ref 0.0–0.5)
HEMATOCRIT: 34.8 % — AB (ref 36.0–46.0)
HEMOGLOBIN: 10.9 g/dL — AB (ref 12.0–15.0)
IMMATURE GRANULOCYTES: 0 %
LYMPHS ABS: 2.7 10*3/uL (ref 0.7–4.0)
LYMPHS PCT: 45 %
MCH: 27.1 pg (ref 26.0–34.0)
MCHC: 31.3 g/dL (ref 30.0–36.0)
MCV: 86.6 fL (ref 80.0–100.0)
Monocytes Absolute: 0.5 10*3/uL (ref 0.1–1.0)
Monocytes Relative: 9 %
NEUTROS ABS: 2.3 10*3/uL (ref 1.7–7.7)
NEUTROS PCT: 39 %
NRBC: 0 % (ref 0.0–0.2)
Platelets: 271 10*3/uL (ref 150–400)
RBC: 4.02 MIL/uL (ref 3.87–5.11)
RDW: 14.3 % (ref 11.5–15.5)
WBC: 5.8 10*3/uL (ref 4.0–10.5)

## 2018-02-01 LAB — CANCER ANTIGEN 27.29: CA 27.29: 4.2 U/mL (ref 0.0–38.6)

## 2018-02-04 ENCOUNTER — Inpatient Hospital Stay (HOSPITAL_BASED_OUTPATIENT_CLINIC_OR_DEPARTMENT_OTHER): Payer: PRIVATE HEALTH INSURANCE | Admitting: Internal Medicine

## 2018-02-04 ENCOUNTER — Encounter: Payer: Self-pay | Admitting: Internal Medicine

## 2018-02-04 ENCOUNTER — Inpatient Hospital Stay: Payer: PRIVATE HEALTH INSURANCE

## 2018-02-04 VITALS — BP 129/72 | HR 79 | Temp 97.8°F | Resp 16 | Wt 209.0 lb

## 2018-02-04 DIAGNOSIS — Z79899 Other long term (current) drug therapy: Secondary | ICD-10-CM

## 2018-02-04 DIAGNOSIS — I10 Essential (primary) hypertension: Secondary | ICD-10-CM

## 2018-02-04 DIAGNOSIS — Z7984 Long term (current) use of oral hypoglycemic drugs: Secondary | ICD-10-CM

## 2018-02-04 DIAGNOSIS — C50812 Malignant neoplasm of overlapping sites of left female breast: Secondary | ICD-10-CM

## 2018-02-04 DIAGNOSIS — D649 Anemia, unspecified: Secondary | ICD-10-CM | POA: Diagnosis not present

## 2018-02-04 DIAGNOSIS — M8580 Other specified disorders of bone density and structure, unspecified site: Secondary | ICD-10-CM | POA: Diagnosis not present

## 2018-02-04 DIAGNOSIS — Z171 Estrogen receptor negative status [ER-]: Principal | ICD-10-CM

## 2018-02-04 DIAGNOSIS — E119 Type 2 diabetes mellitus without complications: Secondary | ICD-10-CM

## 2018-02-04 DIAGNOSIS — Z7982 Long term (current) use of aspirin: Secondary | ICD-10-CM | POA: Diagnosis not present

## 2018-02-04 DIAGNOSIS — Z9221 Personal history of antineoplastic chemotherapy: Secondary | ICD-10-CM | POA: Diagnosis not present

## 2018-02-04 LAB — FERRITIN: Ferritin: 110 ng/mL (ref 11–307)

## 2018-02-04 LAB — IRON AND TIBC
IRON: 119 ug/dL (ref 28–170)
Saturation Ratios: 35 % — ABNORMAL HIGH (ref 10.4–31.8)
TIBC: 338 ug/dL (ref 250–450)
UIBC: 219 ug/dL

## 2018-02-04 LAB — RETICULOCYTES
IMMATURE RETIC FRACT: 9.9 % (ref 2.3–15.9)
RBC.: 4.2 MIL/uL (ref 3.87–5.11)
Retic Count, Absolute: 46.6 10*3/uL (ref 19.0–186.0)
Retic Ct Pct: 1.1 % (ref 0.4–3.1)

## 2018-02-04 LAB — VITAMIN B12: VITAMIN B 12: 480 pg/mL (ref 180–914)

## 2018-02-04 LAB — C-REACTIVE PROTEIN: CRP: <0.8 mg/dL (ref <1.0)

## 2018-02-04 LAB — LACTATE DEHYDROGENASE: LDH: 150 U/L (ref 98–192)

## 2018-02-04 LAB — FOLATE: FOLATE: 35 ng/mL (ref >5.9)

## 2018-02-04 NOTE — Assessment & Plan Note (Addendum)
#   LEFT BREAST -  Stage I triple breast cancer status post lumpectomy; radiation. Status post adjuvant chemotherapy. STABLE.  # Clinically no evidence of recurrence. Mammo-April 2019WNL.   # Left breast tenderness-likely neuropathic.  Breast exam normal.  If worse to let us know.  # Anemia-? etiology Hb 10.9/STABLE; on PO iron. Check iron studies/ferritin/MM panel; kappa-lambda; CRP.    # BMD- 2017- osteopenia- on ca+vitD- continue exercise.  Defer to PCP  # DISPOSITION: # labs today [already ordered] # follow up in 1 years/cbc/ [id number]/[id number] [please order]-Dr.B.   CC: Ms. Boykin Reaper.

## 2018-02-04 NOTE — Progress Notes (Signed)
[facility] OFFICE PROGRESS NOTE  Patient Care Team: Marinda Elk, MD as PCP - General (Physician Assistant) Bary Castilla Forest Gleason, MD as Consulting Physician (General Surgery)  Cancer Staging No matching staging information was found for the patient.   Oncology History   # FEB 2010- Left breast:  T1 C., N0, M0, triple negative treated with wide excisions/p Lumpectomy and sentinel node biopsy. [Dr.Byrnett]; s/p Adj Chemo [Dr.Yabanez]; s/p RT;   # CKD [creat 1.18/ DM-2 poorly controlled]    DIAGNOSIS: Left breast cancer  STAGE:  I       ;GOALS: cure  CURRENT/MOST RECENT THERAPY: surveillaince      Breast cancer ([place])   05/26/2008 Initial Diagnosis    Breast cancer (HCC)     Carcinoma of overlapping sites of left breast in female, estrogen receptor negative ([place])      INTERVAL HISTORY:  Clemencia Course 72 y.o.  female pleasant patient above history of  Stage I breast cancer is here for a follow-up.  Patient complains of mild stinging pain in the left breast around the site of surgery/nipple; last for about 10 to 15 minutes results.  Denies any lumps or bumps.  Denies any discharge.  No blood in stools black or stools.  Appetite is good.  No weight loss.  No nausea vomiting.  No cough.  No headaches.  Review of Systems  Constitutional: Negative for chills, diaphoresis, fever, malaise/fatigue and weight loss.  HENT: Negative for nosebleeds and sore throat.   Eyes: Negative for double vision.  Respiratory: Negative for cough, hemoptysis, sputum production, shortness of breath and wheezing.   Cardiovascular: Negative for chest pain, palpitations, orthopnea and leg swelling.  Gastrointestinal: Negative for abdominal pain, blood in stool, constipation, diarrhea, heartburn, melena, nausea and vomiting.  Genitourinary: Negative for dysuria, frequency and urgency.  Musculoskeletal: Negative for back pain and joint pain.  Skin: Negative.  Negative for  itching and rash.  Neurological: Negative for dizziness, tingling, focal weakness, weakness and headaches.  Endo/Heme/Allergies: Does not bruise/bleed easily.  Psychiatric/Behavioral: Negative for depression. The patient is not nervous/anxious and does not have insomnia.      PAST MEDICAL HISTORY :  Past Medical History:  Diagnosis Date  . Anemia   . Arthritis    hips, legs  . Back pain   . Cancer [facility]) February 24,2010   Left breast:  T1 C., N0, M0, triple negative treated with wide excision, mastoplasty and sentinel node biopsy in February 2010  . Cataracts, both eyes   . Complication of anesthesia    hard to put to sleep needs more  . Diabetes mellitus without complication (HCC)    AGE 31  . Glaucoma   . Glaucoma   . Hollenhorst plaque, both eyes   . Hypertension   . Morbid obesity ([place])   . Neuropathy 2010  . S/P chemotherapy, time since greater than 12 weeks    left breast  . S/P radiation therapy > 12 wks ago    left breast cancer 2010  . Ulcer    STOMACH    PAST SURGICAL HISTORY :   Past Surgical History:  Procedure Laterality Date  . BREAST BIOPSY Left    negative 2007  . BREAST BIOPSY Right 07/2015   complex sclerosing lesion  . BREAST BIOPSY Right 07/27/2015   COMPLEX SCLEROSING LESION WITH USUAL DUCTAL HYPERPLASIA AND CALCIFICATIONS  . BREAST CYST ASPIRATION Right    2002  . BREAST EXCISIONAL BIOPSY Left  positive 2010  . BREAST LUMPECTOMY Left    left breast cancer  . BREAST SURGERY Left 2010   LEFT BREAST WIDE EXCISION  . COLONOSCOPY  2012, 12/16/2015   [place]  . COLONOSCOPY WITH PROPOFOL N/A 12/16/2015   Procedure: COLONOSCOPY WITH PROPOFOL;  Surgeon: Manya Silvas, MD;  Location[facility] ENDOSCOPY;  Service: Endoscopy;  Laterality: N/A;  . DILATATION & CURETTAGE/HYSTEROSCOPY WITH MYOSURE N/A 07/01/2015   Procedure: DILATATION & CURETTAGE/HYSTEROSCOPY WITH MYOSURE;  Surgeon: Boykin Nearing, MD;  Location: [facility] ORS;  Service: Gynecology;   Laterality: N/A;  . DILATION AND CURETTAGE OF UTERUS    . EYE SURGERY     FOR GLAUCOMA  . PHOTOCOAGULATION Left 03/19/2016   Procedure: PHOTOCOAGULATION;  Surgeon: Ronnell Freshwater, MD;  Location: [place];  Service: Ophthalmology;  Laterality: Left;  Diabetic - oral meds CANNOT arrive before 8AM IVA Block  . SENTINEL LYMPH NODE BIOPSY Left 2010  . TOE SURGERY      FAMILY HISTORY :   Family History  Problem Relation Age of Onset  . Breast cancer Sister 53  . Breast cancer Sister 17  . Breast cancer Other     SOCIAL HISTORY:   Social History   Tobacco Use  . Smoking status: Never Smoker  . Smokeless tobacco: Never Used  Substance Use Topics  . Alcohol use: No  . Drug use: No    ALLERGIES:  is allergic to brimonidine; dorzolamide; flagyl [metronidazole]; lisinopril; naprosyn [naproxen]; tegaderm ag mesh [silver]; doxycycline; and tape.  MEDICATIONS:  Current Outpatient Medications  Medication Sig Dispense Refill  . acetaminophen (TYLENOL) 500 MG tablet Take 500 mg by mouth every 6 (six) hours as needed.    Marland Kitchen aspirin 81 MG tablet Take 81 mg by mouth daily.    . calcium carbonate (OS-CAL) 600 MG TABS Take 600 mg by mouth 2 (two) times daily with a meal.    . Ferrous Sulfate (IRON) 325 (65 FE) MG TABS Take 1 tablet by mouth daily.    . fexofenadine-pseudoephedrine (ALLEGRA-D 24) 180-240 MG 24 hr tablet Take 1 tablet by mouth daily as needed.    . fish oil-omega-3 fatty acids 1000 MG capsule Take 2 g by mouth daily.    . fluticasone (FLONASE) 50 MCG/ACT nasal spray Place 2 sprays into both nostrils daily. As needed    . glipiZIDE (GLUCOTROL) 10 MG tablet Take 10 mg by mouth 2 (two) times daily before a meal.    . latanoprost (XALATAN) 0.005 % ophthalmic solution Place 1 drop into both eyes at bedtime.    Marland Kitchen losartan (COZAAR) 25 MG tablet Take 25 mg by mouth daily.    . Multiple Vitamin (MULTIVITAMIN) capsule Take 1 capsule by mouth daily.    .  sitaGLIPtin-metformin (JANUMET) 50-1000 MG tablet Take 1 tablet by mouth 2 (two) times daily with a meal.    . timolol (BETIMOL) 0.25 % ophthalmic solution Place 1-2 drops into the left eye 2 (two) times daily.      No current facility-administered medications for this visit.     PHYSICAL EXAMINATION: ECOG PERFORMANCE STATUS: 0 - Asymptomatic  BP 129/72 (BP Location: Left Arm, Patient Position: Sitting)   Pulse 79   Temp 97.8 F (36.6 C) (Tympanic)   Resp 16   Wt 209 lb (94.8 kg)   BMI 38.23 kg/m   Filed Weights   02/04/18 1043  Weight: 209 lb (94.8 kg)    Physical Exam  Constitutional: She is oriented to person,  place, and time and well-developed, well-nourished, and in no distress.  HENT:  Head: Normocephalic and atraumatic.  Mouth/Throat: Oropharynx is clear and moist. No oropharyngeal exudate.  Eyes: Pupils are equal, round, and reactive to light.  Neck: Normal range of motion. Neck supple.  Cardiovascular: Normal rate and regular rhythm.  Pulmonary/Chest: No respiratory distress. She has no wheezes.  Abdominal: Soft. Bowel sounds are normal. She exhibits no distension and no mass. There is no tenderness. There is no rebound and no guarding.  Musculoskeletal: Normal range of motion. She exhibits no edema or tenderness.  Neurological: She is alert and oriented to person, place, and time.  Skin: Skin is warm.  Right and left BREAST exam (in the presence of nurse)- no unusual skin changes or dominant masses felt. Surgical scars noted.    Psychiatric: Affect normal.    Breast exam- defred.  LABORATORY DATA:  I have reviewed the data as listed    Component Value Date/Time   NA 136 01/31/2018 0941   NA 140 10/01/2011 1349   K 4.6 01/31/2018 0941   K 4.5 10/01/2011 1349   CL 102 01/31/2018 0941   CL 104 10/01/2011 1349   CO2 25 01/31/2018 0941   CO2 27 10/01/2011 1349   GLUCOSE 138 (H) 01/31/2018 0941   GLUCOSE 138 (H) 10/01/2011 1349   BUN 19 01/31/2018 0941    BUN 16 10/01/2011 1349   CREATININE 0.94 01/31/2018 0941   CREATININE 0.89 01/06/2014 0938   CALCIUM 8.6 (L) 01/31/2018 0941   CALCIUM 10.1 10/01/2011 1349   PROT 7.1 01/31/2018 0941   PROT 7.1 01/06/2014 0938   ALBUMIN 3.6 01/31/2018 0941   ALBUMIN 3.2 (L) 01/06/2014 0938   AST 19 01/31/2018 0941   AST 18 01/06/2014 0938   ALT 16 01/31/2018 0941   ALT 24 01/06/2014 0938   ALKPHOS 66 01/31/2018 0941   ALKPHOS 84 01/06/2014 0938   BILITOT 0.6 01/31/2018 0941   BILITOT 0.3 01/06/2014 0938   GFRNONAA 60 (L) 01/31/2018 0941   GFRNONAA >60 01/06/2014 0938   GFRNONAA 60 (L) 10/01/2012 0912   GFRAA >60 01/31/2018 0941   GFRAA >60 01/06/2014 0938   GFRAA >60 10/01/2012 0912    No results found for: SPEP, UPEP  Lab Results  Component Value Date   WBC 5.8 01/31/2018   NEUTROABS 2.3 01/31/2018   HGB 10.9 (L) 01/31/2018   HCT 34.8 (L) 01/31/2018   MCV 86.6 01/31/2018   PLT 271 01/31/2018      Chemistry      Component Value Date/Time   NA 136 01/31/2018 0941   NA 140 10/01/2011 1349   K 4.6 01/31/2018 0941   K 4.5 10/01/2011 1349   CL 102 01/31/2018 0941   CL 104 10/01/2011 1349   CO2 25 01/31/2018 0941   CO2 27 10/01/2011 1349   BUN 19 01/31/2018 0941   BUN 16 10/01/2011 1349   CREATININE 0.94 01/31/2018 0941   CREATININE 0.89 01/06/2014 0938      Component Value Date/Time   CALCIUM 8.6 (L) 01/31/2018 0941   CALCIUM 10.1 10/01/2011 1349   ALKPHOS 66 01/31/2018 0941   ALKPHOS 84 01/06/2014 0938   AST 19 01/31/2018 0941   AST 18 01/06/2014 0938   ALT 16 01/31/2018 0941   ALT 24 01/06/2014 0938   BILITOT 0.6 01/31/2018 0941   BILITOT 0.3 01/06/2014 0938       RADIOGRAPHIC STUDIES: I have personally reviewed the radiological images as listed and agreed with  the findings in the report. No results found.   ASSESSMENT & PLAN:  Carcinoma of overlapping sites of left breast in female, estrogen receptor negative ([place]) # LEFT BREAST -  Stage I triple breast cancer  status post lumpectomy; radiation. Status post adjuvant chemotherapy. STABLE.  # Clinically no evidence of recurrence. Mammo-April 2019WNL.   # Left breast tenderness-likely neuropathic.  Breast exam normal.  If worse to let us know.  # Anemia-? etiology Hb 10.9/STABLE; on PO iron. Check iron studies/ferritin/MM panel; kappa-lambda; CRP.    # BMD- 2017- osteopenia- on ca+vitD- continue exercise.  Defer to PCP  # DISPOSITION: # labs today [already ordered] # follow up in 1 years/cbc/ [id number] [please order]-Dr.B.   CC: Ms. Boykin Reaper.    Orders Placed This Encounter  Procedures  . C-reactive protein    Standing Status:   Future    Number of Occurrences:   1    Standing Expiration Date:   03/11/2019  . Iron and TIBC    Standing Status:   Future    Number of Occurrences:   1    Standing Expiration Date:   03/11/2019  . Ferritin    Standing Status:   Future    Number of Occurrences:   1    Standing Expiration Date:   03/11/2019  . Vitamin B12    Standing Status:   Future    Number of Occurrences:   1    Standing Expiration Date:   02/04/2019  . Reticulocytes    Standing Status:   Future    Number of Occurrences:   1    Standing Expiration Date:   03/11/2019  . Multiple Myeloma Panel (SPEP&IFE w/QIG)    Standing Status:   Future    Number of Occurrences:   1    Standing Expiration Date:   03/11/2019  . Kappa/lambda light chains    Standing Status:   Future    Number of Occurrences:   1    Standing Expiration Date:   03/11/2019  . Folate    Standing Status:   Future    Number of Occurrences:   1    Standing Expiration Date:   02/04/2019  . Lactate dehydrogenase    Standing Status:   Future    Number of Occurrences:   1    Standing Expiration Date:   03/11/2019   All questions were answered. The patient knows to call the clinic with any problems, questions or concerns.      Cammie Sickle, MD 02/04/2018 12:16 PM

## 2018-02-05 LAB — KAPPA/LAMBDA LIGHT CHAINS
KAPPA, LAMDA LIGHT CHAIN RATIO: 1.71 — AB (ref 0.26–1.65)
Kappa free light chain: 40.7 mg/L — ABNORMAL HIGH (ref 3.3–19.4)
LAMDA FREE LIGHT CHAINS: 23.8 mg/L (ref 5.7–26.3)

## 2018-02-06 LAB — MULTIPLE MYELOMA PANEL, SERUM
Albumin SerPl Elph-Mcnc: 3.5 g/dL (ref 2.9–4.4)
Albumin/Glob SerPl: 1.1 (ref 0.7–1.7)
Alpha 1: 0.2 g/dL (ref 0.0–0.4)
Alpha2 Glob SerPl Elph-Mcnc: 0.7 g/dL (ref 0.4–1.0)
B-Globulin SerPl Elph-Mcnc: 1.2 g/dL (ref 0.7–1.3)
GAMMA GLOB SERPL ELPH-MCNC: 1.4 g/dL (ref 0.4–1.8)
GLOBULIN, TOTAL: 3.4 g/dL (ref 2.2–3.9)
IGA: 359 mg/dL (ref 64–422)
IgG (Immunoglobin G), Serum: 1534 mg/dL (ref 700–1600)
IgM (Immunoglobulin M), Srm: 133 mg/dL (ref 26–217)
Total Protein ELP: 6.9 g/dL (ref 6.0–8.5)

## 2018-02-11 ENCOUNTER — Telehealth: Payer: Self-pay | Admitting: Internal Medicine

## 2018-02-11 DIAGNOSIS — Z171 Estrogen receptor negative status [ER-]: Principal | ICD-10-CM

## 2018-02-11 DIAGNOSIS — C50812 Malignant neoplasm of overlapping sites of left female breast: Secondary | ICD-10-CM

## 2018-02-11 NOTE — Addendum Note (Signed)
Addended by: Sandria Bales B on: 02/11/2018 03:07 PM   Modules accepted: Orders

## 2018-02-11 NOTE — Telephone Encounter (Signed)
Selena Contreras please inform patient that-her lab work-up for anemia have come back overall normal.  No major abnormalities noted.   However, I recommend a follow-up-in approximately 6 months [rather than 1 year as currently planned]-please order CBC CMP CA-27-29 M protein kappa lambda light chain.   Colette- please inform re-schedule.   Thanks GB

## 2018-03-07 DIAGNOSIS — H401112 Primary open-angle glaucoma, right eye, moderate stage: Secondary | ICD-10-CM | POA: Diagnosis not present

## 2018-03-07 DIAGNOSIS — H2513 Age-related nuclear cataract, bilateral: Secondary | ICD-10-CM | POA: Diagnosis not present

## 2018-03-07 DIAGNOSIS — H401123 Primary open-angle glaucoma, left eye, severe stage: Secondary | ICD-10-CM | POA: Diagnosis not present

## 2018-03-13 ENCOUNTER — Ambulatory Visit: Payer: Medicare HMO | Admitting: Podiatry

## 2018-03-13 ENCOUNTER — Encounter: Payer: Self-pay | Admitting: Podiatry

## 2018-03-13 DIAGNOSIS — M2012 Hallux valgus (acquired), left foot: Secondary | ICD-10-CM | POA: Diagnosis not present

## 2018-03-13 DIAGNOSIS — B351 Tinea unguium: Secondary | ICD-10-CM | POA: Diagnosis not present

## 2018-03-13 DIAGNOSIS — M2011 Hallux valgus (acquired), right foot: Secondary | ICD-10-CM | POA: Diagnosis not present

## 2018-03-13 DIAGNOSIS — N950 Postmenopausal bleeding: Secondary | ICD-10-CM | POA: Insufficient documentation

## 2018-03-13 DIAGNOSIS — E1142 Type 2 diabetes mellitus with diabetic polyneuropathy: Secondary | ICD-10-CM

## 2018-03-13 DIAGNOSIS — L84 Corns and callosities: Secondary | ICD-10-CM

## 2018-03-13 DIAGNOSIS — M79674 Pain in right toe(s): Secondary | ICD-10-CM | POA: Diagnosis not present

## 2018-03-13 DIAGNOSIS — M79675 Pain in left toe(s): Secondary | ICD-10-CM | POA: Diagnosis not present

## 2018-03-13 DIAGNOSIS — M2040 Other hammer toe(s) (acquired), unspecified foot: Secondary | ICD-10-CM | POA: Diagnosis not present

## 2018-03-13 DIAGNOSIS — G629 Polyneuropathy, unspecified: Secondary | ICD-10-CM | POA: Insufficient documentation

## 2018-03-13 DIAGNOSIS — E1140 Type 2 diabetes mellitus with diabetic neuropathy, unspecified: Secondary | ICD-10-CM | POA: Insufficient documentation

## 2018-03-13 DIAGNOSIS — H409 Unspecified glaucoma: Secondary | ICD-10-CM | POA: Insufficient documentation

## 2018-03-13 NOTE — Progress Notes (Signed)
This patient presents to the office with chief complaint of long thick nails and diabetic feet.  This patient  says there  is  numbness and the sensation of cold feet.in her feet.  This patient says there are long thick painful nails.  These nails are painful walking and wearing shoes.  Patient has no history of infection or drainage from both feet.  Patient is unable to  self treat his own nails . This patient presents  to the office today for treatment of the  long nails and a foot evaluation due to history of  Diabetes. She is also concerned about painful calluses on the bottom of her left foot. Patient has previously been treated for her neuropathy with gabapentin.  She has continued the gabapentin.  General Appearance  Alert, conversant and in no acute stress.  Vascular  Dorsalis pedis and posterior tibial  pulses are palpable  bilaterally.  Capillary return is within normal limits  bilaterally. Temperature is within normal limits  bilaterally.  Neurologic  Senn-Weinstein monofilament wire test within normal limits  bilaterally. Muscle power within normal limits bilaterally.  Nails Thick disfigured discolored nails with subungual debris  from hallux to fifth toes bilaterally. No evidence of bacterial infection or drainage bilaterally.  Orthopedic  No limitations of motion of motion feet .  No crepitus or effusions noted.  HAV  B/L with hammer toes  B/L.  Skin  normotropic skin with no porokeratosis noted bilaterally.  No signs of infections or ulcers noted.   Callus noted sub 1,3 left foot.  Onychomycosis  Calluses  Left foot. Diabetes with no foot complications  IE  Debride nails x 10.  A diabetic foot exam was performed and there is no evidence of any vascular  Or neurologic  pathology.  Patient qualifies for diabetic shoes due to DPN, HAV and hammer toes  B/l.  To be seen by Liliane Channel in January.  RTC 3 months.   Gardiner Barefoot DPM

## 2018-05-21 ENCOUNTER — Telehealth: Payer: Self-pay | Admitting: General Surgery

## 2018-05-21 DIAGNOSIS — J0100 Acute maxillary sinusitis, unspecified: Secondary | ICD-10-CM | POA: Diagnosis not present

## 2018-05-21 NOTE — Telephone Encounter (Signed)
Patient has called and requested to speak with Selena Contreras to discuss a date for her next Mammogram. I did inform the patient that she is in the recall box for her mammogram to be scheduled in April. She would like a call back at phone number [phone].

## 2018-05-21 NOTE — Telephone Encounter (Signed)
Patient contacted today per her request.   The patient wishes for April 2020 mammogram to be scheduled for 07-25-18 in the morning.   Patient also wanting a morning appointment with Dr. Bary Castilla for follow up.   Recall updated with the information above.   Patient aware she will be receiving a letter with date and time for the above appointments closer to time.   She verbalizes understanding.

## 2018-06-03 ENCOUNTER — Other Ambulatory Visit: Payer: Self-pay

## 2018-06-03 DIAGNOSIS — Z1231 Encounter for screening mammogram for malignant neoplasm of breast: Secondary | ICD-10-CM

## 2018-06-05 ENCOUNTER — Ambulatory Visit: Payer: Medicare HMO | Admitting: Podiatry

## 2018-07-04 DIAGNOSIS — D649 Anemia, unspecified: Secondary | ICD-10-CM | POA: Diagnosis not present

## 2018-07-04 DIAGNOSIS — E1140 Type 2 diabetes mellitus with diabetic neuropathy, unspecified: Secondary | ICD-10-CM | POA: Diagnosis not present

## 2018-07-14 DIAGNOSIS — E1140 Type 2 diabetes mellitus with diabetic neuropathy, unspecified: Secondary | ICD-10-CM | POA: Diagnosis not present

## 2018-07-14 DIAGNOSIS — Z6841 Body Mass Index (BMI) 40.0 and over, adult: Secondary | ICD-10-CM | POA: Diagnosis not present

## 2018-07-14 DIAGNOSIS — C50812 Malignant neoplasm of overlapping sites of left female breast: Secondary | ICD-10-CM | POA: Diagnosis not present

## 2018-07-14 DIAGNOSIS — Z171 Estrogen receptor negative status [ER-]: Secondary | ICD-10-CM | POA: Diagnosis not present

## 2018-07-14 DIAGNOSIS — G63 Polyneuropathy in diseases classified elsewhere: Secondary | ICD-10-CM | POA: Diagnosis not present

## 2018-07-14 DIAGNOSIS — D649 Anemia, unspecified: Secondary | ICD-10-CM | POA: Diagnosis not present

## 2018-07-14 DIAGNOSIS — E875 Hyperkalemia: Secondary | ICD-10-CM | POA: Diagnosis not present

## 2018-07-29 ENCOUNTER — Ambulatory Visit: Payer: Medicare HMO | Admitting: General Surgery

## 2018-08-18 ENCOUNTER — Other Ambulatory Visit: Payer: Self-pay

## 2018-08-19 ENCOUNTER — Encounter: Payer: Self-pay | Admitting: Internal Medicine

## 2018-08-19 ENCOUNTER — Inpatient Hospital Stay: Payer: PRIVATE HEALTH INSURANCE | Attending: Internal Medicine | Admitting: Internal Medicine

## 2018-08-19 ENCOUNTER — Inpatient Hospital Stay (HOSPITAL_BASED_OUTPATIENT_CLINIC_OR_DEPARTMENT_OTHER): Payer: PRIVATE HEALTH INSURANCE | Admitting: Internal Medicine

## 2018-08-19 ENCOUNTER — Inpatient Hospital Stay: Payer: PRIVATE HEALTH INSURANCE

## 2018-08-19 DIAGNOSIS — Z79899 Other long term (current) drug therapy: Secondary | ICD-10-CM | POA: Insufficient documentation

## 2018-08-19 DIAGNOSIS — M8580 Other specified disorders of bone density and structure, unspecified site: Secondary | ICD-10-CM | POA: Diagnosis not present

## 2018-08-19 DIAGNOSIS — D6489 Other specified anemias: Secondary | ICD-10-CM

## 2018-08-19 DIAGNOSIS — D649 Anemia, unspecified: Secondary | ICD-10-CM

## 2018-08-19 DIAGNOSIS — R200 Anesthesia of skin: Secondary | ICD-10-CM | POA: Diagnosis not present

## 2018-08-19 DIAGNOSIS — E1122 Type 2 diabetes mellitus with diabetic chronic kidney disease: Secondary | ICD-10-CM | POA: Insufficient documentation

## 2018-08-19 DIAGNOSIS — R202 Paresthesia of skin: Secondary | ICD-10-CM | POA: Insufficient documentation

## 2018-08-19 DIAGNOSIS — N189 Chronic kidney disease, unspecified: Secondary | ICD-10-CM | POA: Insufficient documentation

## 2018-08-19 DIAGNOSIS — J449 Chronic obstructive pulmonary disease, unspecified: Secondary | ICD-10-CM | POA: Diagnosis not present

## 2018-08-19 DIAGNOSIS — C50812 Malignant neoplasm of overlapping sites of left female breast: Secondary | ICD-10-CM

## 2018-08-19 DIAGNOSIS — Z171 Estrogen receptor negative status [ER-]: Secondary | ICD-10-CM

## 2018-08-19 LAB — CBC WITH DIFFERENTIAL/PLATELET
Abs Immature Granulocytes: 0.02 10*3/uL (ref 0.00–0.07)
Basophils Absolute: 0 10*3/uL (ref 0.0–0.1)
Basophils Relative: 0 %
Eosinophils Absolute: 0.4 10*3/uL (ref 0.0–0.5)
Eosinophils Relative: 6 %
HCT: 34.3 % — ABNORMAL LOW (ref 36.0–46.0)
Hemoglobin: 10.9 g/dL — ABNORMAL LOW (ref 12.0–15.0)
Immature Granulocytes: 0 %
Lymphocytes Relative: 37 %
Lymphs Abs: 2.6 10*3/uL (ref 0.7–4.0)
MCH: 27.9 pg (ref 26.0–34.0)
MCHC: 31.8 g/dL (ref 30.0–36.0)
MCV: 87.7 fL (ref 80.0–100.0)
Monocytes Absolute: 0.5 10*3/uL (ref 0.1–1.0)
Monocytes Relative: 8 %
Neutro Abs: 3.3 10*3/uL (ref 1.7–7.7)
Neutrophils Relative %: 49 %
Platelets: 247 10*3/uL (ref 150–400)
RBC: 3.91 MIL/uL (ref 3.87–5.11)
RDW: 14.3 % (ref 11.5–15.5)
WBC: 6.8 10*3/uL (ref 4.0–10.5)
nRBC: 0 % (ref 0.0–0.2)

## 2018-08-19 LAB — COMPREHENSIVE METABOLIC PANEL
ALT: 21 U/L (ref 0–44)
AST: 23 U/L (ref 15–41)
Albumin: 3.6 g/dL (ref 3.5–5.0)
Alkaline Phosphatase: 68 U/L (ref 38–126)
Anion gap: 7 (ref 5–15)
BUN: 20 mg/dL (ref 8–23)
CO2: 25 mmol/L (ref 22–32)
Calcium: 8.8 mg/dL — ABNORMAL LOW (ref 8.9–10.3)
Chloride: 104 mmol/L (ref 98–111)
Creatinine, Ser: 0.97 mg/dL (ref 0.44–1.00)
GFR calc Af Amer: >60 mL/min (ref >60)
GFR calc non Af Amer: 59 mL/min — ABNORMAL LOW (ref >60)
Glucose, Bld: 119 mg/dL — ABNORMAL HIGH (ref 70–99)
Potassium: 4.6 mmol/L (ref 3.5–5.1)
Sodium: 136 mmol/L (ref 135–145)
Total Bilirubin: 0.6 mg/dL (ref 0.3–1.2)
Total Protein: 7.3 g/dL (ref 6.5–8.1)

## 2018-08-19 NOTE — Progress Notes (Signed)
I connected with Selena Contreras on 08/19/18 at 10:15 AM EDT by video enabled telemedicine visit and verified that I am speaking with the correct person using two identifiers.  I discussed the limitations, risks, security and privacy concerns of performing an evaluation and management service by telemedicine and the availability of in-person appointments. I also discussed with the patient that there may be a patient responsible charge related to this service. The patient expressed understanding and agreed to proceed.    Other persons participating in the visit and their role in the encounter: none Patient's location: home  Provider's location: home   Oncology History   # FEB 2010- Left breast:  T1 C., N0, M0, triple negative treated with wide excisions/p Lumpectomy and sentinel node biopsy. [Dr.Byrnett]; s/p Adj Chemo [Dr.Yabanez]; s/p RT;   # chronic anemia [at least 2013]- ? Sep 2017;[facility]; again in 5 years.   # CKD [creat 1.18/ DM-2 poorly controlled]   DIAGNOSIS: Left breast cancer  STAGE:  I       ;GOALS: cure  CURRENT/MOST RECENT THERAPY: surveillaince      Breast cancer ([place])   05/26/2008 Initial Diagnosis    Breast cancer ([place])     Carcinoma of overlapping sites of left breast in female, estrogen receptor negative ([place])     Chief Complaint: breast cancer    History of present illness:Selena Contreras 73 y.o.  female with history of breast cancer stage I triple negative and also history of chronic anemia is here for follow-up.  Patient continues to have chronic intermittent tingling and numbness along the left breast at site of incision.  This is not getting any worse.  No new lumps or bumps.  Denies any blood in stools or black or stools.  No nausea no vomiting headaches.  Observation/objective: Hemoglobin 10.9.  Assessment and plan: Carcinoma of overlapping sites of left breast in female, estrogen receptor negative ([place]) # LEFT BREAST -  Stage I triple  breast cancer status post lumpectomy; radiation.  Stable.  No clinical concerns with recurrence. Mammo-June 1st 2020.    # Left breast tenderness-likely neuropathic.  Stable.  Await mammogram.  # chronic Anemia-unclear etiology.  On p.o. iron.  Hemoglobin between 10-11.  Myeloma markers negative.  Myeloma markers from today pending.  # BMD- 2017- osteopenia- on ca+vitD- continue exercise. Stable.   # DISPOSITION: # follow up in 6 months-MD- cbc/ cmp/iron studies/ferritin/ B12/folate- ca-27-29-Dr.B.   CC: Selena Contreras.   Follow-up instructions:  I discussed the assessment and treatment plan with the patient.  The patient was provided an opportunity to ask questions and all were answered.  The patient agreed with the plan and demonstrated understanding of instructions.  The patient was advised to call back or seek an in person evaluation if the symptoms worsen or if the condition fails to improve as anticipated.   Dr. Charlaine Dalton [place] at [facility] 08/19/2018 11:02 AM

## 2018-08-19 NOTE — Assessment & Plan Note (Signed)
#   LEFT BREAST -  Stage I triple breast cancer status post lumpectomy; radiation.  Stable.  No clinical concerns with recurrence. Mammo-June 1st 2020.    # Left breast tenderness-likely neuropathic.  Stable.  Await mammogram.  # chronic Anemia-unclear etiology.  On p.o. iron.  Hemoglobin between 10-11.  Myeloma markers negative.  Myeloma markers from today pending.  # BMD- 2017- osteopenia- on ca+vitD- continue exercise. Stable.   # DISPOSITION: # follow up in 6 months-MD- cbc/ cmp/iron studies/ferritin/ B12/folate- ca-27-29-Dr.B.   CC: Selena Contreras.

## 2018-08-20 LAB — KAPPA/LAMBDA LIGHT CHAINS
Kappa free light chain: 45.7 mg/L — ABNORMAL HIGH (ref 3.3–19.4)
Kappa, lambda light chain ratio: 1.97 — ABNORMAL HIGH (ref 0.26–1.65)
Lambda free light chains: 23.2 mg/L (ref 5.7–26.3)

## 2018-08-20 LAB — MULTIPLE MYELOMA PANEL, SERUM
Albumin SerPl Elph-Mcnc: 3.4 g/dL (ref 2.9–4.4)
Albumin/Glob SerPl: 1.1 (ref 0.7–1.7)
Alpha 1: 0.2 g/dL (ref 0.0–0.4)
Alpha2 Glob SerPl Elph-Mcnc: 0.7 g/dL (ref 0.4–1.0)
B-Globulin SerPl Elph-Mcnc: 1.1 g/dL (ref 0.7–1.3)
Gamma Glob SerPl Elph-Mcnc: 1.2 g/dL (ref 0.4–1.8)
Globulin, Total: 3.2 g/dL (ref 2.2–3.9)
IgA: 325 mg/dL (ref 64–422)
IgG (Immunoglobin G), Serum: 1620 mg/dL — ABNORMAL HIGH (ref 586–1602)
IgM (Immunoglobulin M), Srm: 83 mg/dL (ref 26–217)
Total Protein ELP: 6.6 g/dL (ref 6.0–8.5)

## 2018-08-20 LAB — CANCER ANTIGEN 27.29: CA 27.29: 10.3 U/mL (ref 0.0–38.6)

## 2018-09-01 ENCOUNTER — Other Ambulatory Visit: Payer: Self-pay

## 2018-09-01 ENCOUNTER — Ambulatory Visit
Admission: RE | Admit: 2018-09-01 | Discharge: 2018-09-01 | Disposition: A | Discharge status: Home / Self Care | Payer: PRIVATE HEALTH INSURANCE | Source: Ambulatory Visit | Attending: General Surgery | Admitting: General Surgery

## 2018-09-01 DIAGNOSIS — Z1231 Encounter for screening mammogram for malignant neoplasm of breast: Secondary | ICD-10-CM | POA: Diagnosis not present

## 2018-09-03 ENCOUNTER — Encounter: Payer: Self-pay | Admitting: Internal Medicine

## 2018-09-09 ENCOUNTER — Encounter: Payer: Self-pay | Admitting: General Surgery

## 2018-09-09 ENCOUNTER — Other Ambulatory Visit: Payer: Self-pay

## 2018-09-09 ENCOUNTER — Ambulatory Visit (INDEPENDENT_AMBULATORY_CARE_PROVIDER_SITE_OTHER): Payer: Medicare HMO | Admitting: General Surgery

## 2018-09-09 VITALS — BP 137/81 | HR 80 | Temp 97.7°F | Resp 18 | Ht 62.0 in | Wt 222.2 lb

## 2018-09-09 DIAGNOSIS — Z853 Personal history of malignant neoplasm of breast: Secondary | ICD-10-CM

## 2018-09-09 NOTE — Patient Instructions (Addendum)
We will reach out to you in May 2021 to schedule your Bilateral screening mammogram for June 2021 and follow up appointment with Dr.Byrnett.    Try taking the Crestor medication in the morning and if it continues to make you dizzy call your physician.

## 2018-09-09 NOTE — Progress Notes (Signed)
Patient ID: Selena Contreras, female   DOB: [birth year], 73 y.o.   MRN: [id number]  Chief Complaint  Patient presents with  . Follow-up    1 year screening mammogram    HPI Selena Contreras is a 73 y.o. female.  Here today for bilateral screening mammogram. Left breast intermittent tenderness, tingling. No nipple drainage or skin changes.  She states she has a skin tag under left breast for many years, no changes.  HPI  Past Medical History:  Diagnosis Date  . Anemia   . Arthritis    hips, legs  . Back pain   . Cancer [facility]) February 24,2010   Left breast:  T1 C., N0, M0, triple negative treated with wide excision, mastoplasty and sentinel node biopsy in February 2010  . Cataracts, both eyes   . Complication of anesthesia    hard to put to sleep needs more  . Diabetes mellitus without complication (HCC)    AGE 63  . Glaucoma   . Glaucoma   . Hollenhorst plaque, both eyes   . Hypertension   . Morbid obesity ([place])   . Neuropathy 2010  . S/P chemotherapy, time since greater than 12 weeks    left breast  . S/P radiation therapy > 12 wks ago    left breast cancer 2010  . Ulcer    STOMACH    Past Surgical History:  Procedure Laterality Date  . BREAST BIOPSY Left    negative 2007  . BREAST BIOPSY Right 07/2015   complex sclerosing lesion  . BREAST BIOPSY Right 07/27/2015   COMPLEX SCLEROSING LESION WITH USUAL DUCTAL HYPERPLASIA AND CALCIFICATIONS  . BREAST CYST ASPIRATION Right    2002  . BREAST EXCISIONAL BIOPSY Left    positive 2010  . BREAST LUMPECTOMY Left    left breast cancer  . BREAST SURGERY Left 2010   LEFT BREAST WIDE EXCISION  . COLONOSCOPY  2012, 12/16/2015   [place]  . COLONOSCOPY WITH PROPOFOL N/A 12/16/2015   Procedure: COLONOSCOPY WITH PROPOFOL;  Surgeon: Manya Silvas, MD;  Location: [facility] ENDOSCOPY;  Service: Endoscopy;  Laterality: N/A;  . DILATATION & CURETTAGE/HYSTEROSCOPY WITH MYOSURE N/A 07/01/2015   Procedure: DILATATION &  CURETTAGE/HYSTEROSCOPY WITH MYOSURE;  Surgeon: Boykin Nearing, MD;  Location: [facility] ORS;  Service: Gynecology;  Laterality: N/A;  . DILATION AND CURETTAGE OF UTERUS    . EYE SURGERY     FOR GLAUCOMA  . PHOTOCOAGULATION Left 03/19/2016   Procedure: PHOTOCOAGULATION;  Surgeon: Ronnell Freshwater, MD;  Location: [place];  Service: Ophthalmology;  Laterality: Left;  Diabetic - oral meds CANNOT arrive before 8AM IVA Block  . SENTINEL LYMPH NODE BIOPSY Left 2010  . TOE SURGERY      Family History  Problem Relation Age of Onset  . Breast cancer Sister 33  . Breast cancer Sister 65  . Breast cancer Other     Social History Social History   Tobacco Use  . Smoking status: Never Smoker  . Smokeless tobacco: Never Used  Substance Use Topics  . Alcohol use: No  . Drug use: No    Allergies  Allergen Reactions  . Brimonidine     Burning sensation in eyes, redness  . Dorzolamide     Burning sensation in eyes, redness  . Flagyl [Metronidazole]     Doesn't remember reaction  . Lisinopril Cough  . Naprosyn [Naproxen]     UNKNOWN   . Tegaderm Ag Mesh [Silver]  Skin irritation   . Doxycycline Other (See Comments)    fatigue per pt  . Tape Rash    Skin irritation. PAPER OK TO USE     Current Outpatient Medications  Medication Sig Dispense Refill  . acetaminophen (TYLENOL) 500 MG tablet Take 500 mg by mouth every 6 (six) hours as needed.    Marland Kitchen aspirin 81 MG tablet Take 81 mg by mouth daily.    . calcium carbonate (OS-CAL) 600 MG TABS Take 600 mg by mouth 2 (two) times daily with a meal.    . Ferrous Sulfate (IRON) 325 (65 FE) MG TABS Take 1 tablet by mouth daily.    . fexofenadine-pseudoephedrine (ALLEGRA-D 24) 180-240 MG 24 hr tablet Take 1 tablet by mouth daily as needed.    . fish oil-omega-3 fatty acids 1000 MG capsule Take 2 g by mouth daily.    . fluticasone (FLONASE) 50 MCG/ACT nasal spray Place 2 sprays into both nostrils daily. As needed    .  glipiZIDE (GLUCOTROL) 10 MG tablet Take 10 mg by mouth 2 (two) times daily before a meal.    . latanoprost (XALATAN) 0.005 % ophthalmic solution Place 1 drop into both eyes at bedtime.    Marland Kitchen losartan (COZAAR) 25 MG tablet Take 25 mg by mouth daily.    . Multiple Vitamin (MULTIVITAMIN) capsule Take 1 capsule by mouth daily.    . sitaGLIPtin-metformin (JANUMET) 50-1000 MG tablet Take 1 tablet by mouth 2 (two) times daily with a meal.    . timolol (TIMOPTIC) 0.5 % ophthalmic solution INSTILL 1 DROP INTO BOTH EYES TWICE DAILY    . rosuvastatin (CRESTOR) 10 MG tablet Take by mouth.     No current facility-administered medications for this visit.     Review of Systems Review of Systems  Constitutional: Negative.   Respiratory: Negative.     Blood pressure 137/81, pulse 80, temperature 97.7 F (36.5 C), temperature source Temporal, resp. rate 18, height 5\' 2"  (1.575 m), weight 222 lb 3.2 oz (100.8 kg), SpO2 98 %.  Physical Exam Physical Exam Exam conducted with a chaperone present.  Constitutional:      Appearance: She is well-developed.  Eyes:     Conjunctiva/sclera: Conjunctivae normal.  Neck:     Musculoskeletal: Normal range of motion.  Cardiovascular:     Rate and Rhythm: Normal rate and regular rhythm.     Heart sounds: Normal heart sounds.  Pulmonary:     Effort: Pulmonary effort is normal.     Breath sounds: Normal breath sounds.  Chest:     Breasts:        Right: Normal.     Lymphadenopathy:     Upper Body:     Right upper body: No supraclavicular or axillary adenopathy.     Left upper body: No supraclavicular or axillary adenopathy.  Skin:    General: Skin is warm and dry.  Neurological:     Mental Status: She is alert and oriented to person, place, and time.     Data Reviewed Bilateral screening mammograms dated September 01, 2018 were independently reviewed.  Postsurgical changes on the left.  BI-RADS-1.  Bone density exam dated January 17, 2016 completed with her  PCP showed a T score of -1.1 in the left hip.  Osteopenia.  Colonoscopy 2017 with Gaylyn Cheers, MD.  Assessment No evidence of recurrent breast cancer.  Plan With her recent weight gain the previous 2 cup size asymmetry between the right and left is now  about 1-1-1/2 cup size difference.  The patient was given a prescription to obtain asymmetry prosthesis.  Patient is a candidate for repeat bone density test at this time.  We will reach out to you in May 2021 to schedule your Bilateral screening mammogram and follow up appointment with Dr.Neosha Switalski. Try taking the Crestor medication in the morning and if it continues to make you dizzy call your physician.     HPI, Physical Exam, Assessment and Plan have been scribed under the direction and in the presence of Robert Bellow, MD. Jonnie Finner, CMA  I have completed the exam and reviewed the above documentation for accuracy and completeness.  I agree with the above.  Haematologist has been used and any errors in dictation or transcription are unintentional.  Hervey Ard, M.D., F.A.C.S.  Forest Gleason Kalai Baca 09/10/2018, 9:28 AM

## 2018-09-26 DIAGNOSIS — H401112 Primary open-angle glaucoma, right eye, moderate stage: Secondary | ICD-10-CM | POA: Diagnosis not present

## 2018-09-26 DIAGNOSIS — H401123 Primary open-angle glaucoma, left eye, severe stage: Secondary | ICD-10-CM | POA: Diagnosis not present

## 2018-09-26 DIAGNOSIS — H2513 Age-related nuclear cataract, bilateral: Secondary | ICD-10-CM | POA: Diagnosis not present

## 2018-10-03 DIAGNOSIS — H401123 Primary open-angle glaucoma, left eye, severe stage: Secondary | ICD-10-CM | POA: Diagnosis not present

## 2018-10-03 DIAGNOSIS — Z1159 Encounter for screening for other viral diseases: Secondary | ICD-10-CM | POA: Diagnosis not present

## 2018-10-03 DIAGNOSIS — Z01812 Encounter for preprocedural laboratory examination: Secondary | ICD-10-CM | POA: Diagnosis not present

## 2018-10-06 DIAGNOSIS — Z6841 Body Mass Index (BMI) 40.0 and over, adult: Secondary | ICD-10-CM | POA: Diagnosis not present

## 2018-10-06 DIAGNOSIS — E119 Type 2 diabetes mellitus without complications: Secondary | ICD-10-CM | POA: Diagnosis not present

## 2018-10-06 DIAGNOSIS — H2512 Age-related nuclear cataract, left eye: Secondary | ICD-10-CM | POA: Diagnosis not present

## 2018-10-06 DIAGNOSIS — E1139 Type 2 diabetes mellitus with other diabetic ophthalmic complication: Secondary | ICD-10-CM | POA: Diagnosis not present

## 2018-10-06 DIAGNOSIS — E1136 Type 2 diabetes mellitus with diabetic cataract: Secondary | ICD-10-CM | POA: Diagnosis not present

## 2018-10-06 DIAGNOSIS — Z794 Long term (current) use of insulin: Secondary | ICD-10-CM | POA: Diagnosis not present

## 2018-10-06 DIAGNOSIS — H401123 Primary open-angle glaucoma, left eye, severe stage: Secondary | ICD-10-CM | POA: Diagnosis not present

## 2018-10-06 DIAGNOSIS — H42 Glaucoma in diseases classified elsewhere: Secondary | ICD-10-CM | POA: Diagnosis not present

## 2018-10-06 DIAGNOSIS — I10 Essential (primary) hypertension: Secondary | ICD-10-CM | POA: Diagnosis not present

## 2018-10-06 DIAGNOSIS — Z7982 Long term (current) use of aspirin: Secondary | ICD-10-CM | POA: Diagnosis not present

## 2018-10-31 ENCOUNTER — Encounter: Payer: Self-pay | Admitting: General Surgery

## 2018-11-11 DIAGNOSIS — D649 Anemia, unspecified: Secondary | ICD-10-CM | POA: Diagnosis not present

## 2018-11-11 DIAGNOSIS — Z0000 Encounter for general adult medical examination without abnormal findings: Secondary | ICD-10-CM | POA: Diagnosis not present

## 2018-11-11 DIAGNOSIS — E1140 Type 2 diabetes mellitus with diabetic neuropathy, unspecified: Secondary | ICD-10-CM | POA: Diagnosis not present

## 2018-11-11 DIAGNOSIS — Z6841 Body Mass Index (BMI) 40.0 and over, adult: Secondary | ICD-10-CM | POA: Diagnosis not present

## 2018-11-11 DIAGNOSIS — G63 Polyneuropathy in diseases classified elsewhere: Secondary | ICD-10-CM | POA: Diagnosis not present

## 2018-12-25 ENCOUNTER — Encounter: Payer: Self-pay | Admitting: Podiatry

## 2018-12-25 ENCOUNTER — Ambulatory Visit: Payer: Medicare HMO | Admitting: Podiatry

## 2018-12-25 ENCOUNTER — Other Ambulatory Visit: Payer: Self-pay

## 2018-12-25 DIAGNOSIS — E1142 Type 2 diabetes mellitus with diabetic polyneuropathy: Secondary | ICD-10-CM

## 2018-12-25 DIAGNOSIS — M2040 Other hammer toe(s) (acquired), unspecified foot: Secondary | ICD-10-CM

## 2018-12-25 DIAGNOSIS — M2012 Hallux valgus (acquired), left foot: Secondary | ICD-10-CM

## 2018-12-25 DIAGNOSIS — M79675 Pain in left toe(s): Secondary | ICD-10-CM

## 2018-12-25 DIAGNOSIS — M2011 Hallux valgus (acquired), right foot: Secondary | ICD-10-CM

## 2018-12-25 DIAGNOSIS — M79674 Pain in right toe(s): Secondary | ICD-10-CM

## 2018-12-25 DIAGNOSIS — B351 Tinea unguium: Secondary | ICD-10-CM

## 2018-12-25 DIAGNOSIS — L84 Corns and callosities: Secondary | ICD-10-CM | POA: Diagnosis not present

## 2018-12-25 NOTE — Progress Notes (Signed)
This patient presents to the office with chief complaint of long thick nails and diabetic feet.  This patient  says there  is  numbness and the sensation of cold feet.in her feet.  This patient says there are long thick painful nails.  These nails are painful walking and wearing shoes.  Patient has no history of infection or drainage from both feet.  Patient is unable to  self treat his own nails . This patient presents  to the office today for treatment of the  long nails and a foot evaluation due to history of  Diabetes. She is also concerned about painful calluses on the bottom of her left foot. Patient has previously been treated for her neuropathy with gabapentin.  She has continued the gabapentin.  General Appearance  Alert, conversant and in no acute stress.  Vascular  Dorsalis pedis and posterior tibial  pulses are palpable  bilaterally.  Capillary return is within normal limits  bilaterally. Temperature is within normal limits  bilaterally.  Neurologic  Senn-Weinstein monofilament wire test within normal limits  bilaterally. Muscle power within normal limits bilaterally.  Nails Thick disfigured discolored nails with subungual debris  from hallux to fifth toes bilaterally. No evidence of bacterial infection or drainage bilaterally.  Orthopedic  No limitations of motion of motion feet .  No crepitus or effusions noted.  HAV  B/L with hammer toes  B/L.  Pes planus.  Skin  normotropic skin with no porokeratosis noted bilaterally.  No signs of infections or ulcers noted.   Callus noted sub 1,3 left foot.  Onychomycosis  Calluses  Left foot. Diabetes with no foot complications  IE  Debride nails x 10.   Patient qualifies for diabetic shoes due to DPN, HAV and hammer toes  B/l.  To be seen by Liliane Channel .  RTC 3 months.for preventative foot care services.   Gardiner Barefoot DPM

## 2018-12-31 ENCOUNTER — Other Ambulatory Visit: Payer: Self-pay

## 2018-12-31 ENCOUNTER — Ambulatory Visit: Payer: Medicare HMO | Admitting: Orthotics

## 2018-12-31 DIAGNOSIS — E1142 Type 2 diabetes mellitus with diabetic polyneuropathy: Secondary | ICD-10-CM

## 2018-12-31 DIAGNOSIS — M2011 Hallux valgus (acquired), right foot: Secondary | ICD-10-CM

## 2018-12-31 NOTE — Progress Notes (Signed)

## 2019-01-05 ENCOUNTER — Other Ambulatory Visit: Payer: Self-pay

## 2019-01-05 DIAGNOSIS — Z20828 Contact with and (suspected) exposure to other viral communicable diseases: Secondary | ICD-10-CM | POA: Diagnosis not present

## 2019-01-05 DIAGNOSIS — Z20822 Contact with and (suspected) exposure to covid-19: Secondary | ICD-10-CM

## 2019-01-05 NOTE — Progress Notes (Unsigned)
la 

## 2019-01-07 LAB — NOVEL CORONAVIRUS, NAA: SARS-CoV-2, NAA: NOT DETECTED

## 2019-01-08 DIAGNOSIS — D649 Anemia, unspecified: Secondary | ICD-10-CM | POA: Diagnosis not present

## 2019-01-08 DIAGNOSIS — E1140 Type 2 diabetes mellitus with diabetic neuropathy, unspecified: Secondary | ICD-10-CM | POA: Diagnosis not present

## 2019-01-09 DIAGNOSIS — H401112 Primary open-angle glaucoma, right eye, moderate stage: Secondary | ICD-10-CM | POA: Diagnosis not present

## 2019-01-09 DIAGNOSIS — H401123 Primary open-angle glaucoma, left eye, severe stage: Secondary | ICD-10-CM | POA: Diagnosis not present

## 2019-01-13 DIAGNOSIS — R87610 Atypical squamous cells of undetermined significance on cytologic smear of cervix (ASC-US): Secondary | ICD-10-CM | POA: Diagnosis not present

## 2019-01-13 DIAGNOSIS — E1142 Type 2 diabetes mellitus with diabetic polyneuropathy: Secondary | ICD-10-CM | POA: Diagnosis not present

## 2019-01-13 DIAGNOSIS — M170 Bilateral primary osteoarthritis of knee: Secondary | ICD-10-CM | POA: Diagnosis not present

## 2019-01-13 DIAGNOSIS — E785 Hyperlipidemia, unspecified: Secondary | ICD-10-CM | POA: Diagnosis not present

## 2019-01-13 DIAGNOSIS — Z6841 Body Mass Index (BMI) 40.0 and over, adult: Secondary | ICD-10-CM | POA: Diagnosis not present

## 2019-01-13 DIAGNOSIS — Z124 Encounter for screening for malignant neoplasm of cervix: Secondary | ICD-10-CM | POA: Diagnosis not present

## 2019-01-13 DIAGNOSIS — D649 Anemia, unspecified: Secondary | ICD-10-CM | POA: Diagnosis not present

## 2019-01-13 DIAGNOSIS — Z1159 Encounter for screening for other viral diseases: Secondary | ICD-10-CM | POA: Diagnosis not present

## 2019-01-13 DIAGNOSIS — E875 Hyperkalemia: Secondary | ICD-10-CM | POA: Diagnosis not present

## 2019-01-13 DIAGNOSIS — Z0000 Encounter for general adult medical examination without abnormal findings: Secondary | ICD-10-CM | POA: Diagnosis not present

## 2019-02-03 ENCOUNTER — Ambulatory Visit: Payer: 59 | Admitting: Internal Medicine

## 2019-02-03 ENCOUNTER — Other Ambulatory Visit: Payer: 59

## 2019-02-04 ENCOUNTER — Other Ambulatory Visit: Payer: Self-pay

## 2019-02-04 ENCOUNTER — Ambulatory Visit: Payer: Medicare HMO | Admitting: Orthotics

## 2019-02-04 DIAGNOSIS — M2040 Other hammer toe(s) (acquired), unspecified foot: Secondary | ICD-10-CM

## 2019-02-04 DIAGNOSIS — L84 Corns and callosities: Secondary | ICD-10-CM

## 2019-02-04 DIAGNOSIS — E1142 Type 2 diabetes mellitus with diabetic polyneuropathy: Secondary | ICD-10-CM

## 2019-02-04 DIAGNOSIS — M2011 Hallux valgus (acquired), right foot: Secondary | ICD-10-CM

## 2019-02-04 DIAGNOSIS — M2012 Hallux valgus (acquired), left foot: Secondary | ICD-10-CM

## 2019-02-04 NOTE — Progress Notes (Signed)
Returning for size smaller

## 2019-02-15 ENCOUNTER — Other Ambulatory Visit: Payer: Self-pay

## 2019-02-15 DIAGNOSIS — C50812 Malignant neoplasm of overlapping sites of left female breast: Secondary | ICD-10-CM

## 2019-02-15 DIAGNOSIS — D649 Anemia, unspecified: Secondary | ICD-10-CM

## 2019-02-17 ENCOUNTER — Inpatient Hospital Stay (HOSPITAL_BASED_OUTPATIENT_CLINIC_OR_DEPARTMENT_OTHER): Payer: Medicare HMO | Admitting: Internal Medicine

## 2019-02-17 ENCOUNTER — Inpatient Hospital Stay: Payer: Medicare HMO | Attending: Internal Medicine

## 2019-02-17 ENCOUNTER — Other Ambulatory Visit: Payer: Self-pay

## 2019-02-17 DIAGNOSIS — Z79899 Other long term (current) drug therapy: Secondary | ICD-10-CM | POA: Diagnosis not present

## 2019-02-17 DIAGNOSIS — Z7984 Long term (current) use of oral hypoglycemic drugs: Secondary | ICD-10-CM | POA: Diagnosis not present

## 2019-02-17 DIAGNOSIS — C50812 Malignant neoplasm of overlapping sites of left female breast: Secondary | ICD-10-CM

## 2019-02-17 DIAGNOSIS — Z803 Family history of malignant neoplasm of breast: Secondary | ICD-10-CM | POA: Insufficient documentation

## 2019-02-17 DIAGNOSIS — Z7982 Long term (current) use of aspirin: Secondary | ICD-10-CM | POA: Insufficient documentation

## 2019-02-17 DIAGNOSIS — E119 Type 2 diabetes mellitus without complications: Secondary | ICD-10-CM | POA: Diagnosis not present

## 2019-02-17 DIAGNOSIS — M8580 Other specified disorders of bone density and structure, unspecified site: Secondary | ICD-10-CM | POA: Insufficient documentation

## 2019-02-17 DIAGNOSIS — I10 Essential (primary) hypertension: Secondary | ICD-10-CM | POA: Diagnosis not present

## 2019-02-17 DIAGNOSIS — Z171 Estrogen receptor negative status [ER-]: Secondary | ICD-10-CM | POA: Diagnosis not present

## 2019-02-17 DIAGNOSIS — D649 Anemia, unspecified: Secondary | ICD-10-CM | POA: Insufficient documentation

## 2019-02-17 LAB — COMPREHENSIVE METABOLIC PANEL
ALT: 18 U/L (ref 0–44)
AST: 19 U/L (ref 15–41)
Albumin: 3.6 g/dL (ref 3.5–5.0)
Alkaline Phosphatase: 59 U/L (ref 38–126)
Anion gap: 6 (ref 5–15)
BUN: 18 mg/dL (ref 8–23)
CO2: 23 mmol/L (ref 22–32)
Calcium: 9 mg/dL (ref 8.9–10.3)
Chloride: 107 mmol/L (ref 98–111)
Creatinine, Ser: 0.91 mg/dL (ref 0.44–1.00)
GFR calc Af Amer: >60 mL/min (ref >60)
GFR calc non Af Amer: >60 mL/min (ref >60)
Glucose, Bld: 128 mg/dL — ABNORMAL HIGH (ref 70–99)
Potassium: 4.5 mmol/L (ref 3.5–5.1)
Sodium: 136 mmol/L (ref 135–145)
Total Bilirubin: 0.7 mg/dL (ref 0.3–1.2)
Total Protein: 7.2 g/dL (ref 6.5–8.1)

## 2019-02-17 LAB — CBC WITH DIFFERENTIAL/PLATELET
Abs Immature Granulocytes: 0.01 10*3/uL (ref 0.00–0.07)
Basophils Absolute: 0 10*3/uL (ref 0.0–0.1)
Basophils Relative: 1 %
Eosinophils Absolute: 0.3 10*3/uL (ref 0.0–0.5)
Eosinophils Relative: 4 %
HCT: 33.1 % — ABNORMAL LOW (ref 36.0–46.0)
Hemoglobin: 10.3 g/dL — ABNORMAL LOW (ref 12.0–15.0)
Immature Granulocytes: 0 %
Lymphocytes Relative: 36 %
Lymphs Abs: 2.4 10*3/uL (ref 0.7–4.0)
MCH: 27.3 pg (ref 26.0–34.0)
MCHC: 31.1 g/dL (ref 30.0–36.0)
MCV: 87.8 fL (ref 80.0–100.0)
Monocytes Absolute: 0.5 10*3/uL (ref 0.1–1.0)
Monocytes Relative: 8 %
Neutro Abs: 3.4 10*3/uL (ref 1.7–7.7)
Neutrophils Relative %: 51 %
Platelets: 278 10*3/uL (ref 150–400)
RBC: 3.77 MIL/uL — ABNORMAL LOW (ref 3.87–5.11)
RDW: 14.4 % (ref 11.5–15.5)
WBC: 6.7 10*3/uL (ref 4.0–10.5)
nRBC: 0 % (ref 0.0–0.2)

## 2019-02-17 LAB — FOLATE: Folate: 37 ng/mL (ref >5.9)

## 2019-02-17 LAB — IRON AND TIBC
Iron: 89 ug/dL (ref 28–170)
Saturation Ratios: 28 % (ref 10.4–31.8)
TIBC: 320 ug/dL (ref 250–450)
UIBC: 231 ug/dL

## 2019-02-17 LAB — FERRITIN: Ferritin: 77 ng/mL (ref 11–307)

## 2019-02-17 LAB — VITAMIN B12: Vitamin B-12: 612 pg/mL (ref 180–914)

## 2019-02-17 NOTE — Assessment & Plan Note (Addendum)
#   LEFT BREAST -  Stage I triple breast cancer status post lumpectomy; radiation.  Stable no clinical concerns with recurrence. Mammo-June 1st 2020-unremarkable.    # Left breast tenderness-likely neuropathic.   # chronic Anemia-unclear etiology.  On p.o. iron.  Hemoglobin between 10-11.  Chronic.  Today hemoglobin 10.3.  Monitor for now.  Myeloma work-up-M protein - May 2020; kappa/lambda light chain slightly abnormal monitor for now  # BMD- 2017- osteopenia- on ca+vitD- continue exercise.  Stable  # DISPOSITION: will send mychart; printout of blood work from today.   # follow up in 6 months-MD- cbc/ cmp/ ca-27-29-Dr.B.   CC: Ms. Boykin Reaper.

## 2019-02-17 NOTE — Progress Notes (Signed)
[place] OFFICE PROGRESS NOTE  Patient Care Team: Marinda Elk, MD as PCP - General (Physician Assistant) Bary Castilla Forest Gleason, MD as Consulting Physician (General Surgery)  Cancer Staging No matching staging information was found for the patient.   Oncology History Overview Note  # FEB 2010- Left breast:  T1 C., N0, M0, triple negative treated with wide excisions/p Lumpectomy and sentinel node biopsy. [Dr.Byrnett]; s/p Adj Chemo [Dr.Yabanez]; s/p RT;   # chronic anemia [at least 2013]- ? Sep 2017;[facility]; again in 5 years.   # CKD [creat 1.18/ DM-2 poorly controlled]   DIAGNOSIS: Left breast cancer  STAGE:  I       ;GOALS: cure  CURRENT/MOST RECENT THERAPY: surveillaince    Breast cancer ([place])  05/26/2008 Initial Diagnosis   Breast cancer (HCC)   Carcinoma of overlapping sites of left breast in female, estrogen receptor negative ([place])      INTERVAL HISTORY:  Selena Contreras 73 y.o.  female pleasant patient above history of  Stage I breast cancer is here for a follow-up.  Patient continues to complain of intermittent pain around the left breast and site of surgery.  No lumps or bumps.  This is chronic.  No headaches.  No nausea no vomiting.  Review of Systems  Constitutional: Negative for chills, diaphoresis, fever, malaise/fatigue and weight loss.  HENT: Negative for nosebleeds and sore throat.   Eyes: Negative for double vision.  Respiratory: Negative for cough, hemoptysis, sputum production, shortness of breath and wheezing.   Cardiovascular: Negative for chest pain, palpitations, orthopnea and leg swelling.  Gastrointestinal: Negative for abdominal pain, blood in stool, constipation, diarrhea, heartburn, melena, nausea and vomiting.  Genitourinary: Negative for dysuria, frequency and urgency.  Musculoskeletal: Negative for back pain and joint pain.  Skin: Negative.  Negative for itching and rash.  Neurological: Negative for dizziness,  tingling, focal weakness, weakness and headaches.  Endo/Heme/Allergies: Does not bruise/bleed easily.  Psychiatric/Behavioral: Negative for depression. The patient is not nervous/anxious and does not have insomnia.     PAST MEDICAL HISTORY :  Past Medical History:  Diagnosis Date  . Anemia   . Arthritis    hips, legs  . Back pain   . Cancer [facility]) February 24,2010   Left breast:  T1 C., N0, M0, triple negative treated with wide excision, mastoplasty and sentinel node biopsy in February 2010  . Cataracts, both eyes   . Complication of anesthesia    hard to put to sleep needs more  . Diabetes mellitus without complication (HCC)    AGE 26  . Glaucoma   . Glaucoma   . Hollenhorst plaque, both eyes   . Hypertension   . Morbid obesity ([place])   . Neuropathy 2010  . S/P chemotherapy, time since greater than 12 weeks    left breast  . S/P radiation therapy > 12 wks ago    left breast cancer 2010  . Ulcer    STOMACH    PAST SURGICAL HISTORY :   Past Surgical History:  Procedure Laterality Date  . BREAST BIOPSY Left    negative 2007  . BREAST BIOPSY Right 07/2015   complex sclerosing lesion  . BREAST BIOPSY Right 07/27/2015   COMPLEX SCLEROSING LESION WITH USUAL DUCTAL HYPERPLASIA AND CALCIFICATIONS  . BREAST CYST ASPIRATION Right    2002  . BREAST EXCISIONAL BIOPSY Left    positive 2010  . BREAST LUMPECTOMY Left    left breast cancer  . BREAST SURGERY Left 2010  LEFT BREAST WIDE EXCISION  . COLONOSCOPY  2012, 12/16/2015   [place]  . COLONOSCOPY WITH PROPOFOL N/A 12/16/2015   Procedure: COLONOSCOPY WITH PROPOFOL;  Surgeon: Manya Silvas, MD;  Location: [facility] ENDOSCOPY;  Service: Endoscopy;  Laterality: N/A;  . DILATATION & CURETTAGE/HYSTEROSCOPY WITH MYOSURE N/A 07/01/2015   Procedure: DILATATION & CURETTAGE/HYSTEROSCOPY WITH MYOSURE;  Surgeon: Boykin Nearing, MD;  Location: [facility] ORS;  Service: Gynecology;  Laterality: N/A;  . DILATION AND CURETTAGE OF UTERUS    .  EYE SURGERY     FOR GLAUCOMA  . PHOTOCOAGULATION Left 03/19/2016   Procedure: PHOTOCOAGULATION;  Surgeon: Ronnell Freshwater, MD;  Location: [place];  Service: Ophthalmology;  Laterality: Left;  Diabetic - oral meds CANNOT arrive before 8AM IVA Block  . SENTINEL LYMPH NODE BIOPSY Left 2010  . TOE SURGERY      FAMILY HISTORY :   Family History  Problem Relation Age of Onset  . Breast cancer Sister 53  . Breast cancer Sister 2  . Breast cancer Other     SOCIAL HISTORY:   Social History   Tobacco Use  . Smoking status: Never Smoker  . Smokeless tobacco: Never Used  Substance Use Topics  . Alcohol use: No  . Drug use: No    ALLERGIES:  is allergic to brimonidine; dorzolamide; flagyl [metronidazole]; lisinopril; naprosyn [naproxen]; tegaderm ag mesh [silver]; doxycycline; and tape.  MEDICATIONS:  Current Outpatient Medications  Medication Sig Dispense Refill  . acetaminophen (TYLENOL) 500 MG tablet Take 500 mg by mouth every 6 (six) hours as needed.    Marland Kitchen aspirin 81 MG tablet Take 81 mg by mouth daily.    . calcium carbonate (OS-CAL) 600 MG TABS Take 600 mg by mouth 2 (two) times daily with a meal.    . Ferrous Sulfate (IRON) 325 (65 FE) MG TABS Take 1 tablet by mouth daily.    . fexofenadine-pseudoephedrine (ALLEGRA-D 24) 180-240 MG 24 hr tablet Take 1 tablet by mouth daily as needed.    . fish oil-omega-3 fatty acids 1000 MG capsule Take 2 g by mouth daily.    . fluticasone (FLONASE) 50 MCG/ACT nasal spray Place 2 sprays into both nostrils daily. As needed    . glipiZIDE (GLUCOTROL) 10 MG tablet Take 10 mg by mouth 2 (two) times daily before a meal.    . latanoprost (XALATAN) 0.005 % ophthalmic solution Place 1 drop into both eyes at bedtime.    Marland Kitchen losartan (COZAAR) 25 MG tablet Take 25 mg by mouth daily.    . Multiple Vitamin (MULTIVITAMIN) capsule Take 1 capsule by mouth daily.    . rosuvastatin (CRESTOR) 10 MG tablet Take by mouth.    .  sitaGLIPtin-metformin (JANUMET) 50-1000 MG tablet Take 1 tablet by mouth 2 (two) times daily with a meal.    . timolol (TIMOPTIC) 0.5 % ophthalmic solution INSTILL 1 DROP INTO BOTH EYES TWICE DAILY     No current facility-administered medications for this visit.     PHYSICAL EXAMINATION: ECOG PERFORMANCE STATUS: 0 - Asymptomatic  BP 131/80 (BP Location: Left Arm, Patient Position: Sitting)   Pulse 75   Temp 98.6 F (37 C) (Tympanic)   Resp 16   Wt 219 lb 3.2 oz (99.4 kg)   BMI 40.09 kg/m   Filed Weights   02/17/19 1013  Weight: 219 lb 3.2 oz (99.4 kg)    Physical Exam  Constitutional: She is oriented to person, place, and time and well-developed, well-nourished, and in no  distress.  HENT:  Head: Normocephalic and atraumatic.  Mouth/Throat: Oropharynx is clear and moist. No oropharyngeal exudate.  Eyes: Pupils are equal, round, and reactive to light.  Neck: Normal range of motion. Neck supple.  Cardiovascular: Normal rate and regular rhythm.  Pulmonary/Chest: No respiratory distress. She has no wheezes.  Abdominal: Soft. Bowel sounds are normal. She exhibits no distension and no mass. There is no abdominal tenderness. There is no rebound and no guarding.  Musculoskeletal: Normal range of motion.        General: No tenderness or edema.  Neurological: She is alert and oriented to person, place, and time.  Skin: Skin is warm.  Right and left BREAST exam (in the presence of nurse)- no unusual skin changes or dominant masses felt. Surgical scars noted.    Psychiatric: Affect normal.    Breast exam- defred.  LABORATORY DATA:  I have reviewed the data as listed    Component Value Date/Time   NA 136 02/17/2019 0952   NA 140 10/01/2011 1349   K 4.5 02/17/2019 0952   K 4.5 10/01/2011 1349   CL 107 02/17/2019 0952   CL 104 10/01/2011 1349   CO2 23 02/17/2019 0952   CO2 27 10/01/2011 1349   GLUCOSE 128 (H) 02/17/2019 0952   GLUCOSE 138 (H) 10/01/2011 1349   BUN 18  02/17/2019 0952   BUN 16 10/01/2011 1349   CREATININE 0.91 02/17/2019 0952   CREATININE 0.89 01/06/2014 0938   CALCIUM 9.0 02/17/2019 0952   CALCIUM 10.1 10/01/2011 1349   PROT 7.2 02/17/2019 0952   PROT 7.1 01/06/2014 0938   ALBUMIN 3.6 02/17/2019 0952   ALBUMIN 3.2 (L) 01/06/2014 0938   AST 19 02/17/2019 0952   AST 18 01/06/2014 0938   ALT 18 02/17/2019 0952   ALT 24 01/06/2014 0938   ALKPHOS 59 02/17/2019 0952   ALKPHOS 84 01/06/2014 0938   BILITOT 0.7 02/17/2019 0952   BILITOT 0.3 01/06/2014 0938   GFRNONAA >60 02/17/2019 0952   GFRNONAA >60 01/06/2014 0938   GFRNONAA 60 (L) 10/01/2012 0912   GFRAA >60 02/17/2019 0952   GFRAA >60 01/06/2014 0938   GFRAA >60 10/01/2012 0912    No results found for: SPEP, UPEP  Lab Results  Component Value Date   WBC 6.7 02/17/2019   NEUTROABS 3.4 02/17/2019   HGB 10.3 (L) 02/17/2019   HCT 33.1 (L) 02/17/2019   MCV 87.8 02/17/2019   PLT 278 02/17/2019      Chemistry      Component Value Date/Time   NA 136 02/17/2019 0952   NA 140 10/01/2011 1349   K 4.5 02/17/2019 0952   K 4.5 10/01/2011 1349   CL 107 02/17/2019 0952   CL 104 10/01/2011 1349   CO2 23 02/17/2019 0952   CO2 27 10/01/2011 1349   BUN 18 02/17/2019 0952   BUN 16 10/01/2011 1349   CREATININE 0.91 02/17/2019 0952   CREATININE 0.89 01/06/2014 0938      Component Value Date/Time   CALCIUM 9.0 02/17/2019 0952   CALCIUM 10.1 10/01/2011 1349   ALKPHOS 59 02/17/2019 0952   ALKPHOS 84 01/06/2014 0938   AST 19 02/17/2019 0952   AST 18 01/06/2014 0938   ALT 18 02/17/2019 0952   ALT 24 01/06/2014 0938   BILITOT 0.7 02/17/2019 0952   BILITOT 0.3 01/06/2014 0938       RADIOGRAPHIC STUDIES: I have personally reviewed the radiological images as listed and agreed with the findings in the report.  No results found.   ASSESSMENT & PLAN:  Carcinoma of overlapping sites of left breast in female, estrogen receptor negative ([place]) # LEFT BREAST -  Stage I triple breast  cancer status post lumpectomy; radiation.  Stable no clinical concerns with recurrence. Mammo-June 1st 2020-unremarkable.    # Left breast tenderness-likely neuropathic.   # chronic Anemia-unclear etiology.  On p.o. iron.  Hemoglobin between 10-11.  Chronic.  Today hemoglobin 10.3.  Monitor for now.  Myeloma work-up-M protein - May 2020; kappa/lambda light chain slightly abnormal monitor for now  # BMD- 2017- osteopenia- on ca+vitD- continue exercise.  Stable  # DISPOSITION: will send mychart; printout of blood work from today.   # follow up in 6 months-MD- cbc/ cmp/ ca-27-29-Dr.B.   CC: Ms. Boykin Reaper.    No orders of the defined types were placed in this encounter.  All questions were answered. The patient knows to call the clinic with any problems, questions or concerns.      Cammie Sickle, MD 03/03/2019 7:36 AM

## 2019-02-18 LAB — KAPPA/LAMBDA LIGHT CHAINS
Kappa free light chain: 49.4 mg/L — ABNORMAL HIGH (ref 3.3–19.4)
Kappa, lambda light chain ratio: 2.14 — ABNORMAL HIGH (ref 0.26–1.65)
Lambda free light chains: 23.1 mg/L (ref 5.7–26.3)

## 2019-02-18 LAB — CANCER ANTIGEN 27.29: CA 27.29: 10.5 U/mL (ref 0.0–38.6)

## 2019-02-23 ENCOUNTER — Other Ambulatory Visit: Payer: Self-pay | Admitting: *Deleted

## 2019-02-23 DIAGNOSIS — C50812 Malignant neoplasm of overlapping sites of left female breast: Secondary | ICD-10-CM

## 2019-03-04 ENCOUNTER — Other Ambulatory Visit: Payer: Self-pay

## 2019-03-04 ENCOUNTER — Ambulatory Visit: Payer: Medicare HMO | Admitting: Orthotics

## 2019-03-04 DIAGNOSIS — M2012 Hallux valgus (acquired), left foot: Secondary | ICD-10-CM

## 2019-03-04 DIAGNOSIS — L84 Corns and callosities: Secondary | ICD-10-CM

## 2019-03-04 DIAGNOSIS — M2011 Hallux valgus (acquired), right foot: Secondary | ICD-10-CM

## 2019-03-04 DIAGNOSIS — E1142 Type 2 diabetes mellitus with diabetic polyneuropathy: Secondary | ICD-10-CM

## 2019-03-04 NOTE — Progress Notes (Signed)
Patient felt the new balance leather shoes were too hard; reordered an apex mesh runner.

## 2019-03-10 DIAGNOSIS — C50912 Malignant neoplasm of unspecified site of left female breast: Secondary | ICD-10-CM | POA: Diagnosis not present

## 2019-03-18 ENCOUNTER — Other Ambulatory Visit: Payer: Medicare HMO | Admitting: Orthotics

## 2019-03-19 ENCOUNTER — Ambulatory Visit: Payer: Medicare HMO | Admitting: Podiatry

## 2019-03-25 ENCOUNTER — Ambulatory Visit (INDEPENDENT_AMBULATORY_CARE_PROVIDER_SITE_OTHER): Payer: Medicare HMO | Admitting: Orthotics

## 2019-03-25 DIAGNOSIS — M2011 Hallux valgus (acquired), right foot: Secondary | ICD-10-CM | POA: Diagnosis not present

## 2019-03-25 DIAGNOSIS — L84 Corns and callosities: Secondary | ICD-10-CM | POA: Diagnosis not present

## 2019-03-25 DIAGNOSIS — E1142 Type 2 diabetes mellitus with diabetic polyneuropathy: Secondary | ICD-10-CM

## 2019-03-25 DIAGNOSIS — M2012 Hallux valgus (acquired), left foot: Secondary | ICD-10-CM | POA: Diagnosis not present

## 2019-03-25 DIAGNOSIS — M2040 Other hammer toe(s) (acquired), unspecified foot: Secondary | ICD-10-CM

## 2019-03-25 NOTE — Progress Notes (Signed)

## 2019-04-08 ENCOUNTER — Other Ambulatory Visit: Payer: Self-pay

## 2019-04-08 ENCOUNTER — Ambulatory Visit: Payer: Medicare HMO | Admitting: Orthotics

## 2019-04-08 DIAGNOSIS — M2040 Other hammer toe(s) (acquired), unspecified foot: Secondary | ICD-10-CM

## 2019-04-08 DIAGNOSIS — L84 Corns and callosities: Secondary | ICD-10-CM

## 2019-04-08 DIAGNOSIS — M2012 Hallux valgus (acquired), left foot: Secondary | ICD-10-CM

## 2019-04-08 DIAGNOSIS — E1142 Type 2 diabetes mellitus with diabetic polyneuropathy: Secondary | ICD-10-CM

## 2019-04-08 DIAGNOSIS — M2011 Hallux valgus (acquired), right foot: Secondary | ICD-10-CM

## 2019-04-08 NOTE — Progress Notes (Signed)
Patient picked up exchanged shoe; I will need to also get another set of DBS inserts, as one was cut too short.

## 2019-04-14 DIAGNOSIS — H401123 Primary open-angle glaucoma, left eye, severe stage: Secondary | ICD-10-CM | POA: Diagnosis not present

## 2019-04-14 DIAGNOSIS — H401112 Primary open-angle glaucoma, right eye, moderate stage: Secondary | ICD-10-CM | POA: Diagnosis not present

## 2019-05-07 DIAGNOSIS — C50912 Malignant neoplasm of unspecified site of left female breast: Secondary | ICD-10-CM | POA: Diagnosis not present

## 2019-07-07 DIAGNOSIS — E1140 Type 2 diabetes mellitus with diabetic neuropathy, unspecified: Secondary | ICD-10-CM | POA: Diagnosis not present

## 2019-07-07 DIAGNOSIS — D649 Anemia, unspecified: Secondary | ICD-10-CM | POA: Diagnosis not present

## 2019-07-07 DIAGNOSIS — E785 Hyperlipidemia, unspecified: Secondary | ICD-10-CM | POA: Diagnosis not present

## 2019-07-14 DIAGNOSIS — M5416 Radiculopathy, lumbar region: Secondary | ICD-10-CM | POA: Diagnosis not present

## 2019-07-14 DIAGNOSIS — M47816 Spondylosis without myelopathy or radiculopathy, lumbar region: Secondary | ICD-10-CM | POA: Diagnosis not present

## 2019-07-14 DIAGNOSIS — D649 Anemia, unspecified: Secondary | ICD-10-CM | POA: Diagnosis not present

## 2019-07-14 DIAGNOSIS — E785 Hyperlipidemia, unspecified: Secondary | ICD-10-CM | POA: Diagnosis not present

## 2019-07-14 DIAGNOSIS — E875 Hyperkalemia: Secondary | ICD-10-CM | POA: Diagnosis not present

## 2019-07-14 DIAGNOSIS — Z6841 Body Mass Index (BMI) 40.0 and over, adult: Secondary | ICD-10-CM | POA: Diagnosis not present

## 2019-07-14 DIAGNOSIS — M8580 Other specified disorders of bone density and structure, unspecified site: Secondary | ICD-10-CM | POA: Diagnosis not present

## 2019-07-14 DIAGNOSIS — E1142 Type 2 diabetes mellitus with diabetic polyneuropathy: Secondary | ICD-10-CM | POA: Diagnosis not present

## 2019-07-14 DIAGNOSIS — Z0000 Encounter for general adult medical examination without abnormal findings: Secondary | ICD-10-CM | POA: Diagnosis not present

## 2019-07-21 ENCOUNTER — Other Ambulatory Visit: Payer: Self-pay | Admitting: General Surgery

## 2019-07-21 DIAGNOSIS — Z1231 Encounter for screening mammogram for malignant neoplasm of breast: Secondary | ICD-10-CM

## 2019-07-21 DIAGNOSIS — M8588 Other specified disorders of bone density and structure, other site: Secondary | ICD-10-CM | POA: Diagnosis not present

## 2019-07-21 DIAGNOSIS — E875 Hyperkalemia: Secondary | ICD-10-CM | POA: Diagnosis not present

## 2019-08-05 DIAGNOSIS — C50912 Malignant neoplasm of unspecified site of left female breast: Secondary | ICD-10-CM | POA: Diagnosis not present

## 2019-08-18 ENCOUNTER — Inpatient Hospital Stay (HOSPITAL_BASED_OUTPATIENT_CLINIC_OR_DEPARTMENT_OTHER): Payer: Medicare HMO | Admitting: Internal Medicine

## 2019-08-18 ENCOUNTER — Other Ambulatory Visit: Payer: Self-pay

## 2019-08-18 ENCOUNTER — Inpatient Hospital Stay: Payer: Medicare HMO | Attending: Internal Medicine

## 2019-08-18 ENCOUNTER — Encounter: Payer: Self-pay | Admitting: Internal Medicine

## 2019-08-18 DIAGNOSIS — Z7982 Long term (current) use of aspirin: Secondary | ICD-10-CM | POA: Diagnosis not present

## 2019-08-18 DIAGNOSIS — M8580 Other specified disorders of bone density and structure, unspecified site: Secondary | ICD-10-CM | POA: Diagnosis not present

## 2019-08-18 DIAGNOSIS — Z803 Family history of malignant neoplasm of breast: Secondary | ICD-10-CM | POA: Insufficient documentation

## 2019-08-18 DIAGNOSIS — E1140 Type 2 diabetes mellitus with diabetic neuropathy, unspecified: Secondary | ICD-10-CM | POA: Insufficient documentation

## 2019-08-18 DIAGNOSIS — D649 Anemia, unspecified: Secondary | ICD-10-CM | POA: Diagnosis not present

## 2019-08-18 DIAGNOSIS — Z79899 Other long term (current) drug therapy: Secondary | ICD-10-CM | POA: Insufficient documentation

## 2019-08-18 DIAGNOSIS — Z853 Personal history of malignant neoplasm of breast: Secondary | ICD-10-CM | POA: Diagnosis not present

## 2019-08-18 DIAGNOSIS — Z923 Personal history of irradiation: Secondary | ICD-10-CM | POA: Insufficient documentation

## 2019-08-18 DIAGNOSIS — E1136 Type 2 diabetes mellitus with diabetic cataract: Secondary | ICD-10-CM | POA: Insufficient documentation

## 2019-08-18 DIAGNOSIS — C50812 Malignant neoplasm of overlapping sites of left female breast: Secondary | ICD-10-CM

## 2019-08-18 DIAGNOSIS — Z171 Estrogen receptor negative status [ER-]: Secondary | ICD-10-CM | POA: Diagnosis not present

## 2019-08-18 DIAGNOSIS — Z7984 Long term (current) use of oral hypoglycemic drugs: Secondary | ICD-10-CM | POA: Diagnosis not present

## 2019-08-18 LAB — COMPREHENSIVE METABOLIC PANEL
ALT: 17 U/L (ref 0–44)
AST: 18 U/L (ref 15–41)
Albumin: 3.5 g/dL (ref 3.5–5.0)
Alkaline Phosphatase: 61 U/L (ref 38–126)
Anion gap: 6 (ref 5–15)
BUN: 24 mg/dL — ABNORMAL HIGH (ref 8–23)
CO2: 25 mmol/L (ref 22–32)
Calcium: 8.9 mg/dL (ref 8.9–10.3)
Chloride: 107 mmol/L (ref 98–111)
Creatinine, Ser: 1.08 mg/dL — ABNORMAL HIGH (ref 0.44–1.00)
GFR calc Af Amer: 59 mL/min — ABNORMAL LOW (ref >60)
GFR calc non Af Amer: 51 mL/min — ABNORMAL LOW (ref >60)
Glucose, Bld: 134 mg/dL — ABNORMAL HIGH (ref 70–99)
Potassium: 4.8 mmol/L (ref 3.5–5.1)
Sodium: 138 mmol/L (ref 135–145)
Total Bilirubin: 0.7 mg/dL (ref 0.3–1.2)
Total Protein: 7.1 g/dL (ref 6.5–8.1)

## 2019-08-18 LAB — CBC WITH DIFFERENTIAL/PLATELET
Abs Immature Granulocytes: 0.02 10*3/uL (ref 0.00–0.07)
Basophils Absolute: 0.1 10*3/uL (ref 0.0–0.1)
Basophils Relative: 1 %
Eosinophils Absolute: 0.2 10*3/uL (ref 0.0–0.5)
Eosinophils Relative: 3 %
HCT: 32.9 % — ABNORMAL LOW (ref 36.0–46.0)
Hemoglobin: 10.4 g/dL — ABNORMAL LOW (ref 12.0–15.0)
Immature Granulocytes: 0 %
Lymphocytes Relative: 29 %
Lymphs Abs: 2 10*3/uL (ref 0.7–4.0)
MCH: 27.3 pg (ref 26.0–34.0)
MCHC: 31.6 g/dL (ref 30.0–36.0)
MCV: 86.4 fL (ref 80.0–100.0)
Monocytes Absolute: 0.6 10*3/uL (ref 0.1–1.0)
Monocytes Relative: 9 %
Neutro Abs: 3.9 10*3/uL (ref 1.7–7.7)
Neutrophils Relative %: 58 %
Platelets: 257 10*3/uL (ref 150–400)
RBC: 3.81 MIL/uL — ABNORMAL LOW (ref 3.87–5.11)
RDW: 14.5 % (ref 11.5–15.5)
WBC: 6.8 10*3/uL (ref 4.0–10.5)
nRBC: 0 % (ref 0.0–0.2)

## 2019-08-18 NOTE — Assessment & Plan Note (Addendum)
#   LEFT BREAST -  Stage I triple breast cancer status post lumpectomy; radiation.  STABLEmammo June 7th 2021-pending.    #Chronic left breast neuropathic pain-stable.  # chronic Anemia-unclear etiology.  On p.o. iron.  Hemoglobin between 10-11.  Hemoglobin 10.3 stable.  # BMD-April 2021- worse;  Osteopenia [Dr.[facility]]-defer to rheumatology.  On ca+vitD- continue exercise  # DISPOSITION: # follow up in 6 months-MD- cbc/ cmp/ ca-27-29-Dr.B.   CC: Ms. Boykin Reaper.

## 2019-08-18 NOTE — Progress Notes (Signed)
RN Chaperoned provider with Breast Exam.   

## 2019-08-18 NOTE — Progress Notes (Signed)
[place] OFFICE PROGRESS NOTE  Patient Care Team: Marinda Elk, MD as PCP - General (Physician Assistant) Bary Castilla Forest Gleason, MD as Consulting Physician (General Surgery) Cammie Sickle, MD as Consulting Physician (Internal Medicine)  Cancer Staging No matching staging information was found for the patient.   Oncology History Overview Note  # FEB 2010- Left breast:  T1 C., N0, M0, triple negative treated with wide excisions/p Lumpectomy and sentinel node biopsy. [Dr.Byrnett]; s/p Adj Chemo [Dr.Yabanez]; s/p RT;   # chronic anemia [at least 2013]- ? Sep 2017;colo-[facility]ke; again in 5 years.   # CKD [creat 1.18/ DM-2 poorly controlled]   DIAGNOSIS: Left breast cancer  STAGE:  I       ;GOALS: cure  CURRENT/MOST RECENT THERAPY: surveillaince    Breast cancer ([place])  05/26/2008 Initial Diagnosis   Breast cancer (HCC)   Carcinoma of overlapping sites of left breast in female, estrogen receptor negative ([place])    INTERVAL HISTORY:  Selena Contreras 74 y.o.  female pleasant patient above history of  Stage I breast cancer is here for a follow-up.  Patient denies any new onset of bone pain or joint pains.  No new lumps or bumps.  Appetite is good with no weight loss.  No headaches.  Review of Systems  Constitutional: Negative for chills, diaphoresis, fever, malaise/fatigue and weight loss.  HENT: Negative for nosebleeds and sore throat.   Eyes: Negative for double vision.  Respiratory: Negative for cough, hemoptysis, sputum production, shortness of breath and wheezing.   Cardiovascular: Negative for chest pain, palpitations, orthopnea and leg swelling.  Gastrointestinal: Negative for abdominal pain, blood in stool, constipation, diarrhea, heartburn, melena, nausea and vomiting.  Genitourinary: Negative for dysuria, frequency and urgency.  Musculoskeletal: Negative for back pain and joint pain.  Skin: Negative.  Negative for itching and rash.   Neurological: Negative for dizziness, tingling, focal weakness, weakness and headaches.  Endo/Heme/Allergies: Does not bruise/bleed easily.  Psychiatric/Behavioral: Negative for depression. The patient is not nervous/anxious and does not have insomnia.     PAST MEDICAL HISTORY :  Past Medical History:  Diagnosis Date  . Anemia   . Arthritis    hips, legs  . Back pain   . Cancer [facility]) February 24,2010   Left breast:  T1 C., N0, M0, triple negative treated with wide excision, mastoplasty and sentinel node biopsy in February 2010  . Cataracts, both eyes   . Complication of anesthesia    hard to put to sleep needs more  . Diabetes mellitus without complication (HCC)    AGE 36  . Glaucoma   . Glaucoma   . Hollenhorst plaque, both eyes   . Hypertension   . Morbid obesity ([place])   . Neuropathy 2010  . S/P chemotherapy, time since greater than 12 weeks    left breast  . S/P radiation therapy > 12 wks ago    left breast cancer 2010  . Ulcer    STOMACH    PAST SURGICAL HISTORY :   Past Surgical History:  Procedure Laterality Date  . BREAST BIOPSY Left    negative 2007  . BREAST BIOPSY Right 07/2015   complex sclerosing lesion  . BREAST BIOPSY Right 07/27/2015   COMPLEX SCLEROSING LESION WITH USUAL DUCTAL HYPERPLASIA AND CALCIFICATIONS  . BREAST CYST ASPIRATION Right    2002  . BREAST EXCISIONAL BIOPSY Left    positive 2010  . BREAST LUMPECTOMY Left    left breast cancer  . BREAST  SURGERY Left 2010   LEFT BREAST WIDE EXCISION  . COLONOSCOPY  2012, 12/16/2015   [place]  . COLONOSCOPY WITH PROPOFOL N/A 12/16/2015   Procedure: COLONOSCOPY WITH PROPOFOL;  Surgeon: Manya Silvas, MD;  Location: [facility] ENDOSCOPY;  Service: Endoscopy;  Laterality: N/A;  . DILATATION & CURETTAGE/HYSTEROSCOPY WITH MYOSURE N/A 07/01/2015   Procedure: DILATATION & CURETTAGE/HYSTEROSCOPY WITH MYOSURE;  Surgeon: Boykin Nearing, MD;  Location: [facility] ORS;  Service: Gynecology;  Laterality: N/A;  .  DILATION AND CURETTAGE OF UTERUS    . EYE SURGERY     FOR GLAUCOMA  . PHOTOCOAGULATION Left 03/19/2016   Procedure: PHOTOCOAGULATION;  Surgeon: Ronnell Freshwater, MD;  Location: [place];  Service: Ophthalmology;  Laterality: Left;  Diabetic - oral meds CANNOT arrive before 8AM IVA Block  . SENTINEL LYMPH NODE BIOPSY Left 2010  . TOE SURGERY      FAMILY HISTORY :   Family History  Problem Relation Age of Onset  . Breast cancer Sister 80  . Breast cancer Sister 67  . Breast cancer Other     SOCIAL HISTORY:   Social History   Tobacco Use  . Smoking status: Never Smoker  . Smokeless tobacco: Never Used  Substance Use Topics  . Alcohol use: No  . Drug use: No    ALLERGIES:  is allergic to brimonidine; dorzolamide; flagyl [metronidazole]; lisinopril; naprosyn [naproxen]; tegaderm ag mesh [silver]; doxycycline; and tape.  MEDICATIONS:  Current Outpatient Medications  Medication Sig Dispense Refill  . acetaminophen (TYLENOL) 500 MG tablet Take 500 mg by mouth every 6 (six) hours as needed.    Marland Kitchen aspirin 81 MG tablet Take 81 mg by mouth daily.    . calcium carbonate (OS-CAL) 600 MG TABS Take 600 mg by mouth 2 (two) times daily with a meal.    . Ferrous Sulfate (IRON) 325 (65 FE) MG TABS Take 1 tablet by mouth daily.    . fexofenadine-pseudoephedrine (ALLEGRA-D 24) 180-240 MG 24 hr tablet Take 1 tablet by mouth daily as needed.    . fish oil-omega-3 fatty acids 1000 MG capsule Take 2 g by mouth daily.    . fluticasone (FLONASE) 50 MCG/ACT nasal spray Place 2 sprays into both nostrils daily. As needed    . glipiZIDE (GLUCOTROL) 10 MG tablet TAKE 1/2 TABLETS (5 MG TOTAL) BY MOUTH 2 (TWO) TIMES DAILY BEFORE MEALS    . hydrochlorothiazide (HYDRODIURIL) 12.5 MG tablet Take by mouth.    . latanoprost (XALATAN) 0.005 % ophthalmic solution Place 1 drop into both eyes at bedtime.    . Multiple Vitamin (MULTIVITAMIN) capsule Take 1 capsule by mouth daily.    . rosuvastatin  (CRESTOR) 10 MG tablet Take by mouth.    . sitaGLIPtin-metformin (JANUMET) 50-1000 MG tablet Take 1 tablet by mouth 2 (two) times daily with a meal.    . timolol (TIMOPTIC) 0.5 % ophthalmic solution INSTILL 1 DROP INTO BOTH EYES TWICE DAILY    . losartan (COZAAR) 25 MG tablet Take 25 mg by mouth daily.     No current facility-administered medications for this visit.    PHYSICAL EXAMINATION: ECOG PERFORMANCE STATUS: 0 - Asymptomatic  BP (!) 141/68 (BP Location: Left Arm, Patient Position: Sitting)   Pulse 68   Temp (!) 97.3 F (36.3 C) (Tympanic)   Resp 18   Wt 226 lb (102.5 kg)   SpO2 100%   BMI 41.34 kg/m   Filed Weights   08/18/19 1117  Weight: 226 lb (102.5 kg)  Physical Exam  Constitutional: She is oriented to person, place, and time and well-developed, well-nourished, and in no distress.  HENT:  Head: Normocephalic and atraumatic.  Mouth/Throat: Oropharynx is clear and moist. No oropharyngeal exudate.  Eyes: Pupils are equal, round, and reactive to light.  Cardiovascular: Normal rate and regular rhythm.  Pulmonary/Chest: No respiratory distress. She has no wheezes.  Abdominal: Soft. Bowel sounds are normal. She exhibits no distension and no mass. There is no abdominal tenderness. There is no rebound and no guarding.  Musculoskeletal:        General: No tenderness or edema. Normal range of motion.     Cervical back: Normal range of motion and neck supple.  Neurological: She is alert and oriented to person, place, and time.  Skin: Skin is warm.  Right and left BREAST exam (in the presence of nurse)- no unusual skin changes or dominant masses felt. Surgical scars noted.    Psychiatric: Affect normal.    Breast exam- defred.  LABORATORY DATA:  I have reviewed the data as listed    Component Value Date/Time   NA 138 08/18/2019 1031   NA 140 10/01/2011 1349   K 4.8 08/18/2019 1031   K 4.5 10/01/2011 1349   CL 107 08/18/2019 1031   CL 104 10/01/2011 1349   CO2  25 08/18/2019 1031   CO2 27 10/01/2011 1349   GLUCOSE 134 (H) 08/18/2019 1031   GLUCOSE 138 (H) 10/01/2011 1349   BUN 24 (H) 08/18/2019 1031   BUN 16 10/01/2011 1349   CREATININE 1.08 (H) 08/18/2019 1031   CREATININE 0.89 01/06/2014 0938   CALCIUM 8.9 08/18/2019 1031   CALCIUM 10.1 10/01/2011 1349   PROT 7.1 08/18/2019 1031   PROT 7.1 01/06/2014 0938   ALBUMIN 3.5 08/18/2019 1031   ALBUMIN 3.2 (L) 01/06/2014 0938   AST 18 08/18/2019 1031   AST 18 01/06/2014 0938   ALT 17 08/18/2019 1031   ALT 24 01/06/2014 0938   ALKPHOS 61 08/18/2019 1031   ALKPHOS 84 01/06/2014 0938   BILITOT 0.7 08/18/2019 1031   BILITOT 0.3 01/06/2014 0938   GFRNONAA 51 (L) 08/18/2019 1031   GFRNONAA >60 01/06/2014 0938   GFRNONAA 60 (L) 10/01/2012 0912   GFRAA 59 (L) 08/18/2019 1031   GFRAA >60 01/06/2014 0938   GFRAA >60 10/01/2012 0912    No results found for: SPEP, UPEP  Lab Results  Component Value Date   WBC 6.8 08/18/2019   NEUTROABS 3.9 08/18/2019   HGB 10.4 (L) 08/18/2019   HCT 32.9 (L) 08/18/2019   MCV 86.4 08/18/2019   PLT 257 08/18/2019      Chemistry      Component Value Date/Time   NA 138 08/18/2019 1031   NA 140 10/01/2011 1349   K 4.8 08/18/2019 1031   K 4.5 10/01/2011 1349   CL 107 08/18/2019 1031   CL 104 10/01/2011 1349   CO2 25 08/18/2019 1031   CO2 27 10/01/2011 1349   BUN 24 (H) 08/18/2019 1031   BUN 16 10/01/2011 1349   CREATININE 1.08 (H) 08/18/2019 1031   CREATININE 0.89 01/06/2014 0938      Component Value Date/Time   CALCIUM 8.9 08/18/2019 1031   CALCIUM 10.1 10/01/2011 1349   ALKPHOS 61 08/18/2019 1031   ALKPHOS 84 01/06/2014 0938   AST 18 08/18/2019 1031   AST 18 01/06/2014 0938   ALT 17 08/18/2019 1031   ALT 24 01/06/2014 0938   BILITOT 0.7 08/18/2019 1031  BILITOT 0.3 01/06/2014 [id number]       RADIOGRAPHIC STUDIES: I have personally reviewed the radiological images as listed and agreed with the findings in the report. No results found.    ASSESSMENT & PLAN:  Carcinoma of overlapping sites of left breast in female, estrogen receptor negative ([place]) # LEFT BREAST -  Stage I triple breast cancer status post lumpectomy; radiation.  STABLEmammo June 7th 2021-pending.    #Chronic left breast neuropathic pain-stable.  # chronic Anemia-unclear etiology.  On p.o. iron.  Hemoglobin between 10-11.  Hemoglobin 10.3 stable.  # BMD-April 2021- worse;  Osteopenia [Dr.[facility]le]-defer to rheumatology.  On ca+vitD- continue exercise  # DISPOSITION: # follow up in 6 months-MD- cbc/ cmp/ ca-27-29-Dr.B.   CC: Ms. Boykin Reaper.    No orders of the defined types were placed in this encounter.  All questions were answered. The patient knows to call the clinic with any problems, questions or concerns.      Cammie Sickle, MD 08/18/2019 12:57 PM

## 2019-08-18 NOTE — Progress Notes (Signed)
Pt in for 6 months follow up, denies any concerns today.

## 2019-08-19 LAB — KAPPA/LAMBDA LIGHT CHAINS
Kappa free light chain: 48.1 mg/L — ABNORMAL HIGH (ref 3.3–19.4)
Kappa, lambda light chain ratio: 2.15 — ABNORMAL HIGH (ref 0.26–1.65)
Lambda free light chains: 22.4 mg/L (ref 5.7–26.3)

## 2019-08-19 LAB — CANCER ANTIGEN 27.29: CA 27.29: 8.4 U/mL (ref 0.0–38.6)

## 2019-08-20 DIAGNOSIS — E1140 Type 2 diabetes mellitus with diabetic neuropathy, unspecified: Secondary | ICD-10-CM | POA: Diagnosis not present

## 2019-08-20 DIAGNOSIS — E785 Hyperlipidemia, unspecified: Secondary | ICD-10-CM | POA: Diagnosis not present

## 2019-08-20 DIAGNOSIS — D649 Anemia, unspecified: Secondary | ICD-10-CM | POA: Diagnosis not present

## 2019-08-20 DIAGNOSIS — I10 Essential (primary) hypertension: Secondary | ICD-10-CM | POA: Diagnosis not present

## 2019-08-21 LAB — MULTIPLE MYELOMA PANEL, SERUM
Albumin SerPl Elph-Mcnc: 3.5 g/dL (ref 2.9–4.4)
Albumin/Glob SerPl: 1.2 (ref 0.7–1.7)
Alpha 1: 0.2 g/dL (ref 0.0–0.4)
Alpha2 Glob SerPl Elph-Mcnc: 0.6 g/dL (ref 0.4–1.0)
B-Globulin SerPl Elph-Mcnc: 1.1 g/dL (ref 0.7–1.3)
Gamma Glob SerPl Elph-Mcnc: 1.2 g/dL (ref 0.4–1.8)
Globulin, Total: 3.1 g/dL (ref 2.2–3.9)
IgA: 299 mg/dL (ref 64–422)
IgG (Immunoglobin G), Serum: 1372 mg/dL (ref 586–1602)
IgM (Immunoglobulin M), Srm: 78 mg/dL (ref 26–217)
Total Protein ELP: 6.6 g/dL (ref 6.0–8.5)

## 2019-09-02 ENCOUNTER — Other Ambulatory Visit: Payer: Self-pay

## 2019-09-02 ENCOUNTER — Emergency Department
Admission: EM | Admit: 2019-09-02 | Discharge: 2019-09-02 | Disposition: A | Discharge status: Home / Self Care | Payer: Medicare HMO | Attending: Emergency Medicine | Admitting: Emergency Medicine

## 2019-09-02 ENCOUNTER — Encounter: Payer: Self-pay | Admitting: Emergency Medicine

## 2019-09-02 ENCOUNTER — Emergency Department: Payer: Medicare HMO

## 2019-09-02 DIAGNOSIS — Z5321 Procedure and treatment not carried out due to patient leaving prior to being seen by health care provider: Secondary | ICD-10-CM | POA: Diagnosis not present

## 2019-09-02 DIAGNOSIS — R079 Chest pain, unspecified: Secondary | ICD-10-CM | POA: Diagnosis not present

## 2019-09-02 DIAGNOSIS — R0789 Other chest pain: Secondary | ICD-10-CM | POA: Insufficient documentation

## 2019-09-02 LAB — TROPONIN I (HIGH SENSITIVITY)
Troponin I (High Sensitivity): 3 ng/L (ref <18)
Troponin I (High Sensitivity): 3 ng/L (ref <18)

## 2019-09-02 LAB — CBC
HCT: 34.6 % — ABNORMAL LOW (ref 36.0–46.0)
Hemoglobin: 11.3 g/dL — ABNORMAL LOW (ref 12.0–15.0)
MCH: 28 pg (ref 26.0–34.0)
MCHC: 32.7 g/dL (ref 30.0–36.0)
MCV: 85.9 fL (ref 80.0–100.0)
Platelets: 272 10*3/uL (ref 150–400)
RBC: 4.03 MIL/uL (ref 3.87–5.11)
RDW: 14.6 % (ref 11.5–15.5)
WBC: 8.5 10*3/uL (ref 4.0–10.5)
nRBC: 0 % (ref 0.0–0.2)

## 2019-09-02 LAB — BASIC METABOLIC PANEL
Anion gap: 9 (ref 5–15)
BUN: 24 mg/dL — ABNORMAL HIGH (ref 8–23)
CO2: 25 mmol/L (ref 22–32)
Calcium: 9.4 mg/dL (ref 8.9–10.3)
Chloride: 109 mmol/L (ref 98–111)
Creatinine, Ser: 1.19 mg/dL — ABNORMAL HIGH (ref 0.44–1.00)
GFR calc Af Amer: 53 mL/min — ABNORMAL LOW (ref >60)
GFR calc non Af Amer: 46 mL/min — ABNORMAL LOW (ref >60)
Glucose, Bld: 183 mg/dL — ABNORMAL HIGH (ref 70–99)
Potassium: 4.4 mmol/L (ref 3.5–5.1)
Sodium: 143 mmol/L (ref 135–145)

## 2019-09-02 MED ORDER — SODIUM CHLORIDE 0.9% FLUSH: 3.0000 mL | Freq: Once | INTRAVENOUS | Status: DC

## 2019-09-02 NOTE — ED Triage Notes (Addendum)
Arrives from PCP for ED evaluation of intermittent CP x 3-4 days.  States pain seems to be worse in the afternoons.  Also c/o black stools.  Patient take iron supplement.  AAOx3.  Skin warm and dry. NAD

## 2019-09-07 ENCOUNTER — Ambulatory Visit
Admission: RE | Admit: 2019-09-07 | Discharge: 2019-09-07 | Disposition: A | Discharge status: Home / Self Care | Payer: PRIVATE HEALTH INSURANCE | Source: Ambulatory Visit | Attending: General Surgery | Admitting: General Surgery

## 2019-09-07 ENCOUNTER — Other Ambulatory Visit: Payer: Self-pay

## 2019-09-07 DIAGNOSIS — Z1231 Encounter for screening mammogram for malignant neoplasm of breast: Secondary | ICD-10-CM | POA: Insufficient documentation

## 2019-09-15 DIAGNOSIS — H401123 Primary open-angle glaucoma, left eye, severe stage: Secondary | ICD-10-CM | POA: Diagnosis not present

## 2019-09-15 DIAGNOSIS — H401112 Primary open-angle glaucoma, right eye, moderate stage: Secondary | ICD-10-CM | POA: Diagnosis not present

## 2019-09-17 ENCOUNTER — Other Ambulatory Visit: Payer: Self-pay | Admitting: General Surgery

## 2019-09-17 DIAGNOSIS — Z853 Personal history of malignant neoplasm of breast: Secondary | ICD-10-CM | POA: Diagnosis not present

## 2019-09-23 ENCOUNTER — Ambulatory Visit: Payer: Medicare HMO

## 2019-09-24 ENCOUNTER — Other Ambulatory Visit: Payer: Self-pay

## 2019-09-24 ENCOUNTER — Ambulatory Visit
Admission: RE | Admit: 2019-09-24 | Discharge: 2019-09-24 | Disposition: A | Discharge status: Home / Self Care | Payer: Medicare HMO | Source: Ambulatory Visit | Attending: General Surgery | Admitting: General Surgery

## 2019-09-24 DIAGNOSIS — Z1231 Encounter for screening mammogram for malignant neoplasm of breast: Secondary | ICD-10-CM | POA: Diagnosis not present

## 2019-09-24 HISTORY — DX: Personal history of antineoplastic chemotherapy: Z92.21

## 2019-09-24 HISTORY — DX: Personal history of irradiation: Z92.3

## 2019-09-28 ENCOUNTER — Other Ambulatory Visit: Payer: Self-pay | Admitting: General Surgery

## 2019-09-28 DIAGNOSIS — N6489 Other specified disorders of breast: Secondary | ICD-10-CM

## 2019-09-28 DIAGNOSIS — R928 Other abnormal and inconclusive findings on diagnostic imaging of breast: Secondary | ICD-10-CM

## 2019-10-01 ENCOUNTER — Other Ambulatory Visit: Payer: Self-pay | Admitting: General Surgery

## 2019-10-01 DIAGNOSIS — R928 Other abnormal and inconclusive findings on diagnostic imaging of breast: Secondary | ICD-10-CM

## 2019-10-02 ENCOUNTER — Ambulatory Visit
Admission: RE | Admit: 2019-10-02 | Discharge: 2019-10-02 | Disposition: A | Discharge status: Home / Self Care | Payer: Medicare HMO | Source: Ambulatory Visit | Attending: General Surgery | Admitting: General Surgery

## 2019-10-02 DIAGNOSIS — R928 Other abnormal and inconclusive findings on diagnostic imaging of breast: Secondary | ICD-10-CM | POA: Diagnosis not present

## 2019-10-02 DIAGNOSIS — N6489 Other specified disorders of breast: Secondary | ICD-10-CM | POA: Insufficient documentation

## 2019-10-02 DIAGNOSIS — Z853 Personal history of malignant neoplasm of breast: Secondary | ICD-10-CM | POA: Diagnosis not present

## 2019-10-16 DIAGNOSIS — E6609 Other obesity due to excess calories: Secondary | ICD-10-CM | POA: Diagnosis not present

## 2019-10-16 DIAGNOSIS — N3281 Overactive bladder: Secondary | ICD-10-CM | POA: Diagnosis not present

## 2019-10-21 ENCOUNTER — Telehealth: Payer: Self-pay | Admitting: Podiatry

## 2019-10-21 NOTE — Telephone Encounter (Signed)
Pt called stating she is needing the other pair of diabetic inserts. She stated she got her shoes and only 1 pr of inserts in January. Your note states one pair was cut to short.  Can you please order her other inserts and call and let pt know that you have done so.

## 2019-10-26 DIAGNOSIS — N3941 Urge incontinence: Secondary | ICD-10-CM | POA: Diagnosis not present

## 2019-10-26 DIAGNOSIS — R351 Nocturia: Secondary | ICD-10-CM | POA: Diagnosis not present

## 2019-10-26 DIAGNOSIS — N3281 Overactive bladder: Secondary | ICD-10-CM | POA: Diagnosis not present

## 2019-11-13 DIAGNOSIS — E875 Hyperkalemia: Secondary | ICD-10-CM | POA: Diagnosis not present

## 2019-12-03 DIAGNOSIS — E875 Hyperkalemia: Secondary | ICD-10-CM | POA: Diagnosis not present

## 2019-12-04 DIAGNOSIS — Z20822 Contact with and (suspected) exposure to covid-19: Secondary | ICD-10-CM | POA: Diagnosis not present

## 2020-01-13 DIAGNOSIS — E785 Hyperlipidemia, unspecified: Secondary | ICD-10-CM | POA: Diagnosis not present

## 2020-01-13 DIAGNOSIS — E1140 Type 2 diabetes mellitus with diabetic neuropathy, unspecified: Secondary | ICD-10-CM | POA: Diagnosis not present

## 2020-01-13 DIAGNOSIS — D649 Anemia, unspecified: Secondary | ICD-10-CM | POA: Diagnosis not present

## 2020-01-20 DIAGNOSIS — E1142 Type 2 diabetes mellitus with diabetic polyneuropathy: Secondary | ICD-10-CM | POA: Diagnosis not present

## 2020-01-20 DIAGNOSIS — Z6841 Body Mass Index (BMI) 40.0 and over, adult: Secondary | ICD-10-CM | POA: Diagnosis not present

## 2020-01-20 DIAGNOSIS — Z853 Personal history of malignant neoplasm of breast: Secondary | ICD-10-CM | POA: Diagnosis not present

## 2020-01-20 DIAGNOSIS — Z1231 Encounter for screening mammogram for malignant neoplasm of breast: Secondary | ICD-10-CM | POA: Diagnosis not present

## 2020-01-20 DIAGNOSIS — D649 Anemia, unspecified: Secondary | ICD-10-CM | POA: Diagnosis not present

## 2020-01-20 DIAGNOSIS — Z0000 Encounter for general adult medical examination without abnormal findings: Secondary | ICD-10-CM | POA: Diagnosis not present

## 2020-01-20 DIAGNOSIS — E785 Hyperlipidemia, unspecified: Secondary | ICD-10-CM | POA: Diagnosis not present

## 2020-01-20 DIAGNOSIS — I10 Essential (primary) hypertension: Secondary | ICD-10-CM | POA: Diagnosis not present

## 2020-01-20 DIAGNOSIS — M5416 Radiculopathy, lumbar region: Secondary | ICD-10-CM | POA: Diagnosis not present

## 2020-02-01 DIAGNOSIS — M545 Low back pain, unspecified: Secondary | ICD-10-CM | POA: Diagnosis not present

## 2020-02-01 DIAGNOSIS — G8929 Other chronic pain: Secondary | ICD-10-CM | POA: Diagnosis not present

## 2020-02-05 DIAGNOSIS — H401112 Primary open-angle glaucoma, right eye, moderate stage: Secondary | ICD-10-CM | POA: Diagnosis not present

## 2020-02-05 DIAGNOSIS — H401123 Primary open-angle glaucoma, left eye, severe stage: Secondary | ICD-10-CM | POA: Diagnosis not present

## 2020-02-15 ENCOUNTER — Other Ambulatory Visit: Payer: Self-pay

## 2020-02-15 DIAGNOSIS — Z171 Estrogen receptor negative status [ER-]: Secondary | ICD-10-CM

## 2020-02-15 DIAGNOSIS — C50812 Malignant neoplasm of overlapping sites of left female breast: Secondary | ICD-10-CM

## 2020-02-16 ENCOUNTER — Other Ambulatory Visit: Payer: Self-pay

## 2020-02-16 ENCOUNTER — Encounter: Payer: Self-pay | Admitting: Internal Medicine

## 2020-02-16 ENCOUNTER — Inpatient Hospital Stay: Payer: Medicare HMO | Attending: Internal Medicine

## 2020-02-16 ENCOUNTER — Inpatient Hospital Stay: Payer: Medicare HMO | Admitting: Internal Medicine

## 2020-02-16 DIAGNOSIS — Z7984 Long term (current) use of oral hypoglycemic drugs: Secondary | ICD-10-CM | POA: Diagnosis not present

## 2020-02-16 DIAGNOSIS — Z9221 Personal history of antineoplastic chemotherapy: Secondary | ICD-10-CM | POA: Insufficient documentation

## 2020-02-16 DIAGNOSIS — M1990 Unspecified osteoarthritis, unspecified site: Secondary | ICD-10-CM | POA: Insufficient documentation

## 2020-02-16 DIAGNOSIS — Z79899 Other long term (current) drug therapy: Secondary | ICD-10-CM | POA: Insufficient documentation

## 2020-02-16 DIAGNOSIS — C50812 Malignant neoplasm of overlapping sites of left female breast: Secondary | ICD-10-CM

## 2020-02-16 DIAGNOSIS — I10 Essential (primary) hypertension: Secondary | ICD-10-CM | POA: Diagnosis not present

## 2020-02-16 DIAGNOSIS — Z7982 Long term (current) use of aspirin: Secondary | ICD-10-CM | POA: Diagnosis not present

## 2020-02-16 DIAGNOSIS — M542 Cervicalgia: Secondary | ICD-10-CM | POA: Insufficient documentation

## 2020-02-16 DIAGNOSIS — Z171 Estrogen receptor negative status [ER-]: Secondary | ICD-10-CM

## 2020-02-16 DIAGNOSIS — Z803 Family history of malignant neoplasm of breast: Secondary | ICD-10-CM | POA: Insufficient documentation

## 2020-02-16 DIAGNOSIS — D649 Anemia, unspecified: Secondary | ICD-10-CM | POA: Diagnosis not present

## 2020-02-16 DIAGNOSIS — Z853 Personal history of malignant neoplasm of breast: Secondary | ICD-10-CM | POA: Diagnosis not present

## 2020-02-16 DIAGNOSIS — E1136 Type 2 diabetes mellitus with diabetic cataract: Secondary | ICD-10-CM | POA: Diagnosis not present

## 2020-02-16 DIAGNOSIS — Z923 Personal history of irradiation: Secondary | ICD-10-CM | POA: Insufficient documentation

## 2020-02-16 DIAGNOSIS — M8580 Other specified disorders of bone density and structure, unspecified site: Secondary | ICD-10-CM | POA: Insufficient documentation

## 2020-02-16 LAB — COMPREHENSIVE METABOLIC PANEL
ALT: 22 U/L (ref 0–44)
AST: 23 U/L (ref 15–41)
Albumin: 3.6 g/dL (ref 3.5–5.0)
Alkaline Phosphatase: 70 U/L (ref 38–126)
Anion gap: 8 (ref 5–15)
BUN: 23 mg/dL (ref 8–23)
CO2: 25 mmol/L (ref 22–32)
Calcium: 8.9 mg/dL (ref 8.9–10.3)
Chloride: 104 mmol/L (ref 98–111)
Creatinine, Ser: 0.97 mg/dL (ref 0.44–1.00)
GFR, Estimated: >60 mL/min (ref >60)
Glucose, Bld: 146 mg/dL — ABNORMAL HIGH (ref 70–99)
Potassium: 4.8 mmol/L (ref 3.5–5.1)
Sodium: 137 mmol/L (ref 135–145)
Total Bilirubin: 0.7 mg/dL (ref 0.3–1.2)
Total Protein: 7.3 g/dL (ref 6.5–8.1)

## 2020-02-16 LAB — CBC WITH DIFFERENTIAL/PLATELET
Abs Immature Granulocytes: 0.04 10*3/uL (ref 0.00–0.07)
Basophils Absolute: 0.1 10*3/uL (ref 0.0–0.1)
Basophils Relative: 1 %
Eosinophils Absolute: 0.4 10*3/uL (ref 0.0–0.5)
Eosinophils Relative: 5 %
HCT: 34.3 % — ABNORMAL LOW (ref 36.0–46.0)
Hemoglobin: 10.9 g/dL — ABNORMAL LOW (ref 12.0–15.0)
Immature Granulocytes: 1 %
Lymphocytes Relative: 27 %
Lymphs Abs: 2.3 10*3/uL (ref 0.7–4.0)
MCH: 27.2 pg (ref 26.0–34.0)
MCHC: 31.8 g/dL (ref 30.0–36.0)
MCV: 85.5 fL (ref 80.0–100.0)
Monocytes Absolute: 0.7 10*3/uL (ref 0.1–1.0)
Monocytes Relative: 8 %
Neutro Abs: 4.8 10*3/uL (ref 1.7–7.7)
Neutrophils Relative %: 58 %
Platelets: 269 10*3/uL (ref 150–400)
RBC: 4.01 MIL/uL (ref 3.87–5.11)
RDW: 14.5 % (ref 11.5–15.5)
Smear Review: NORMAL
WBC: 8.3 10*3/uL (ref 4.0–10.5)
nRBC: 0 % (ref 0.0–0.2)

## 2020-02-16 NOTE — Progress Notes (Signed)
Has been having pain in the shoulder, neck and hip area on the left side where she had cancer on x3-4 months. Wants A1c added to labs

## 2020-02-16 NOTE — Assessment & Plan Note (Addendum)
#   LEFT BREAST -  Stage I triple breast cancer status post lumpectomy; radiation.  STABLE; mammo June 7th 2021- WNL.   # neck pain/back pain- ? Etiology- MSK ? Improved with Lasix. Hold off bone scan.   #Chronic left breast neuropathic pain- STABLE.   # chronic Anemia-unclear etiology.  On p.o. iron.  Hemoglobin between 10-11.  Hemoglobin 10.9 STABLE.  # BMD-April 2021- worse;  Osteopenia [[facility]]- On ca+vitD- continue exercise  # DISPOSITION: # follow up in 12 months-MD- cbc/ cmp/ ca-27-29-Dr.B.   CC: Ms. Boykin Reaper.

## 2020-02-16 NOTE — Progress Notes (Signed)
[place] OFFICE PROGRESS NOTE  Patient Care Team: Marinda Elk, MD as PCP - General (Physician Assistant) Bary Castilla Forest Gleason, MD as Consulting Physician (General Surgery) Cammie Sickle, MD as Consulting Physician (Internal Medicine)  Cancer Staging No matching staging information was found for the patient.   Oncology History Overview Note  # FEB 2010- Left breast:  T1 C., N0, M0, triple negative treated with wide excisions/p Lumpectomy and sentinel node biopsy. [Dr.Byrnett]; s/p Adj Chemo [Dr.Yabanez]; s/p RT;   # chronic anemia [at least 2013]- ? Sep 2017;colo-[facility]ke; again in 5 years.   # CKD [creat 1.18/ DM-2 poorly controlled]   DIAGNOSIS: Left breast cancer  STAGE:  I       ;GOALS: cure  CURRENT/MOST RECENT THERAPY: surveillaince    Breast cancer ([place])  05/26/2008 Initial Diagnosis   Breast cancer (HCC)   Carcinoma of overlapping sites of left breast in female, estrogen receptor negative ([place])    INTERVAL HISTORY:  Clemencia Course 74 y.o.  female pleasant patient above history of  Stage I breast cancer is here for a follow-up.  Patient complains of neck pain especially with movement.  However currently improved.  She states Lasix might help the pain.  Otherwise no nausea no vomiting pain no headaches.  No new shortness of breath or cough.   Review of Systems  Constitutional: Negative for chills, diaphoresis, fever, malaise/fatigue and weight loss.  HENT: Negative for nosebleeds and sore throat.   Eyes: Negative for double vision.  Respiratory: Negative for cough, hemoptysis, sputum production, shortness of breath and wheezing.   Cardiovascular: Negative for chest pain, palpitations, orthopnea and leg swelling.  Gastrointestinal: Negative for abdominal pain, blood in stool, constipation, diarrhea, heartburn, melena, nausea and vomiting.  Genitourinary: Negative for dysuria, frequency and urgency.  Musculoskeletal: Positive for  back pain and neck pain. Negative for joint pain.  Skin: Negative.  Negative for itching and rash.  Neurological: Negative for dizziness, tingling, focal weakness, weakness and headaches.  Endo/Heme/Allergies: Does not bruise/bleed easily.  Psychiatric/Behavioral: Negative for depression. The patient is not nervous/anxious and does not have insomnia.     PAST MEDICAL HISTORY :  Past Medical History:  Diagnosis Date  . Anemia   . Arthritis    hips, legs  . Back pain   . Breast cancer ([place]) 2010   LUMPECTOMY/ chemo/rad  . Cancer [facility]) February 24,2010   Left breast:  T1 C., N0, M0, triple negative treated with wide excision, mastoplasty and sentinel node biopsy in February 2010  . Cataracts, both eyes   . Complication of anesthesia    hard to put to sleep needs more  . Diabetes mellitus without complication (HCC)    AGE 88  . Glaucoma   . Glaucoma   . Hollenhorst plaque, both eyes   . Hypertension   . Morbid obesity ([place])   . Neuropathy 2010  . Personal history of chemotherapy   . Personal history of radiation therapy   . S/P chemotherapy, time since greater than 12 weeks    left breast  . S/P radiation therapy > 12 wks ago    left breast cancer 2010  . Ulcer    STOMACH    PAST SURGICAL HISTORY :   Past Surgical History:  Procedure Laterality Date  . BREAST BIOPSY Left    negative 2007  . BREAST BIOPSY Right 07/2015   complex sclerosing lesion  . BREAST BIOPSY Right 07/27/2015   COMPLEX SCLEROSING LESION WITH USUAL  DUCTAL HYPERPLASIA AND CALCIFICATIONS  . BREAST CYST ASPIRATION Right    2002  . BREAST EXCISIONAL BIOPSY Left    positive 2010  . BREAST LUMPECTOMY Left    left breast cancer  . BREAST SURGERY Left 2010   LEFT BREAST WIDE EXCISION  . COLONOSCOPY  2012, 12/16/2015   [place]  . COLONOSCOPY WITH PROPOFOL N/A 12/16/2015   Procedure: COLONOSCOPY WITH PROPOFOL;  Surgeon: Manya Silvas, MD;  Location: [facility] ENDOSCOPY;  Service: Endoscopy;  Laterality: N/A;   . DILATATION & CURETTAGE/HYSTEROSCOPY WITH MYOSURE N/A 07/01/2015   Procedure: DILATATION & CURETTAGE/HYSTEROSCOPY WITH MYOSURE;  Surgeon: Boykin Nearing, MD;  Location: [facility] ORS;  Service: Gynecology;  Laterality: N/A;  . DILATION AND CURETTAGE OF UTERUS    . EYE SURGERY     FOR GLAUCOMA  . PHOTOCOAGULATION Left 03/19/2016   Procedure: PHOTOCOAGULATION;  Surgeon: Ronnell Freshwater, MD;  Location: [place];  Service: Ophthalmology;  Laterality: Left;  Diabetic - oral meds CANNOT arrive before 8AM IVA Block  . SENTINEL LYMPH NODE BIOPSY Left 2010  . TOE SURGERY      FAMILY HISTORY :   Family History  Problem Relation Age of Onset  . Breast cancer Sister 60  . Breast cancer Sister 2  . Breast cancer Other     SOCIAL HISTORY:   Social History   Tobacco Use  . Smoking status: Never Smoker  . Smokeless tobacco: Never Used  Substance Use Topics  . Alcohol use: No  . Drug use: No    ALLERGIES:  is allergic to brimonidine, dorzolamide, flagyl [metronidazole], lisinopril, naprosyn [naproxen], tegaderm ag mesh [silver], doxycycline, and tape.  MEDICATIONS:  Current Outpatient Medications  Medication Sig Dispense Refill  . acetaminophen (TYLENOL) 500 MG tablet Take 500 mg by mouth every 6 (six) hours as needed.    Marland Kitchen aspirin 81 MG tablet Take 81 mg by mouth daily.    . calcium carbonate (OS-CAL) 600 MG TABS Take 600 mg by mouth 2 (two) times daily with a meal.    . Ferrous Sulfate (IRON) 325 (65 FE) MG TABS Take 1 tablet by mouth daily.    . fexofenadine-pseudoephedrine (ALLEGRA-D 24) 180-240 MG 24 hr tablet Take 1 tablet by mouth daily as needed.    . fish oil-omega-3 fatty acids 1000 MG capsule Take 2 g by mouth daily.    . fluticasone (FLONASE) 50 MCG/ACT nasal spray Place 2 sprays into both nostrils daily. As needed    . furosemide (LASIX) 20 MG tablet     . glipiZIDE (GLUCOTROL) 10 MG tablet TAKE 1/2 TABLETS (5 MG TOTAL) BY MOUTH 2 (TWO) TIMES DAILY  BEFORE MEALS    . latanoprost (XALATAN) 0.005 % ophthalmic solution Place 1 drop into both eyes at bedtime.    . Multiple Vitamin (MULTIVITAMIN) capsule Take 1 capsule by mouth daily.    . rosuvastatin (CRESTOR) 10 MG tablet Take by mouth.    . sitaGLIPtin-metformin (JANUMET) 50-1000 MG tablet Take 1 tablet by mouth 2 (two) times daily with a meal.    . solifenacin (VESICARE) 5 MG tablet     . timolol (TIMOPTIC) 0.5 % ophthalmic solution INSTILL 1 DROP INTO BOTH EYES TWICE DAILY     No current facility-administered medications for this visit.    PHYSICAL EXAMINATION: ECOG PERFORMANCE STATUS: 0 - Asymptomatic  BP (!) 141/80 (BP Location: Right Arm, Patient Position: Sitting)   Pulse 71   Temp 98.4 F (36.9 C) (Tympanic)   Wt 223  lb 9.6 oz (101.4 kg)   SpO2 100%   BMI 40.90 kg/m   Filed Weights   02/16/20 1049  Weight: 223 lb 9.6 oz (101.4 kg)    Physical Exam HENT:     Head: Normocephalic and atraumatic.     Mouth/Throat:     Pharynx: No oropharyngeal exudate.  Eyes:     Pupils: Pupils are equal, round, and reactive to light.  Cardiovascular:     Rate and Rhythm: Normal rate and regular rhythm.  Pulmonary:     Effort: No respiratory distress.     Breath sounds: No wheezing.  Abdominal:     General: Bowel sounds are normal. There is no distension.     Palpations: Abdomen is soft. There is no mass.     Tenderness: There is no abdominal tenderness. There is no guarding or rebound.  Musculoskeletal:        General: No tenderness. Normal range of motion.     Cervical back: Normal range of motion and neck supple.  Skin:    General: Skin is warm.     Comments: Right and left BREAST exam (in the presence of nurse)- no unusual skin changes or dominant masses felt. Surgical scars noted.    Neurological:     Mental Status: She is alert and oriented to person, place, and time.  Psychiatric:        Mood and Affect: Affect normal.     Breast exam- defred.  LABORATORY DATA:   I have reviewed the data as listed    Component Value Date/Time   NA 137 02/16/2020 0958   NA 140 10/01/2011 1349   K 4.8 02/16/2020 0958   K 4.5 10/01/2011 1349   CL 104 02/16/2020 0958   CL 104 10/01/2011 1349   CO2 25 02/16/2020 0958   CO2 27 10/01/2011 1349   GLUCOSE 146 (H) 02/16/2020 0958   GLUCOSE 138 (H) 10/01/2011 1349   BUN 23 02/16/2020 0958   BUN 16 10/01/2011 1349   CREATININE 0.97 02/16/2020 0958   CREATININE 0.89 01/06/2014 0938   CALCIUM 8.9 02/16/2020 0958   CALCIUM 10.1 10/01/2011 1349   PROT 7.3 02/16/2020 0958   PROT 7.1 01/06/2014 0938   ALBUMIN 3.6 02/16/2020 0958   ALBUMIN 3.2 (L) 01/06/2014 0938   AST 23 02/16/2020 0958   AST 18 01/06/2014 0938   ALT 22 02/16/2020 0958   ALT 24 01/06/2014 0938   ALKPHOS 70 02/16/2020 0958   ALKPHOS 84 01/06/2014 0938   BILITOT 0.7 02/16/2020 0958   BILITOT 0.3 01/06/2014 0938   GFRNONAA >60 02/16/2020 0958   GFRNONAA >60 01/06/2014 0938   GFRNONAA 60 (L) 10/01/2012 0912   GFRAA 53 (L) 09/02/2019 1424   GFRAA >60 01/06/2014 0938   GFRAA >60 10/01/2012 0912    No results found for: SPEP, UPEP  Lab Results  Component Value Date   WBC 8.3 02/16/2020   NEUTROABS 4.8 02/16/2020   HGB 10.9 (L) 02/16/2020   HCT 34.3 (L) 02/16/2020   MCV 85.5 02/16/2020   PLT 269 02/16/2020      Chemistry      Component Value Date/Time   NA 137 02/16/2020 0958   NA 140 10/01/2011 1349   K 4.8 02/16/2020 0958   K 4.5 10/01/2011 1349   CL 104 02/16/2020 0958   CL 104 10/01/2011 1349   CO2 25 02/16/2020 0958   CO2 27 10/01/2011 1349   BUN 23 02/16/2020 0958   BUN 16 10/01/2011  1349   CREATININE 0.97 02/16/2020 0958   CREATININE 0.89 01/06/2014 0938      Component Value Date/Time   CALCIUM 8.9 02/16/2020 0958   CALCIUM 10.1 10/01/2011 1349   ALKPHOS 70 02/16/2020 0958   ALKPHOS 84 01/06/2014 0938   AST 23 02/16/2020 0958   AST 18 01/06/2014 0938   ALT 22 02/16/2020 0958   ALT 24 01/06/2014 0938   BILITOT 0.7  02/16/2020 0958   BILITOT 0.3 01/06/2014 0938       RADIOGRAPHIC STUDIES: I have personally reviewed the radiological images as listed and agreed with the findings in the report. No results found.   ASSESSMENT & PLAN:  Carcinoma of overlapping sites of left breast in female, estrogen receptor negative ([place]) # LEFT BREAST -  Stage I triple breast cancer status post lumpectomy; radiation.  STABLE; mammo June 7th 2021- WNL.   # neck pain/back pain- ? Etiology- MSK ? Improved with Lasix. Hold off bone scan.   #Chronic left breast neuropathic pain- STABLE.   # chronic Anemia-unclear etiology.  On p.o. iron.  Hemoglobin between 10-11.  Hemoglobin 10.9 STABLE.  # BMD-April 2021- worse;  Osteopenia [[facility]]- On ca+vitD- continue exercise  # DISPOSITION: # follow up in 12 months-MD- cbc/ cmp/ ca-27-29-Dr.B.   CC: Ms. Boykin Reaper.    Orders Placed This Encounter  Procedures  . CBC with Differential    Standing Status:   Future    Standing Expiration Date:   08/15/2021  . Comprehensive metabolic panel    Standing Status:   Future    Standing Expiration Date:   08/15/2021  . Cancer antigen 27.29    Standing Status:   Future    Standing Expiration Date:   08/15/2021   All questions were answered. The patient knows to call the clinic with any problems, questions or concerns.      Cammie Sickle, MD 02/17/2020 8:50 AM

## 2020-02-17 LAB — CANCER ANTIGEN 27.29: CA 27.29: 4.5 U/mL (ref 0.0–38.6)

## 2020-03-10 ENCOUNTER — Ambulatory Visit: Payer: Medicare HMO | Admitting: Podiatry

## 2020-03-10 ENCOUNTER — Ambulatory Visit (INDEPENDENT_AMBULATORY_CARE_PROVIDER_SITE_OTHER): Payer: Medicare HMO

## 2020-03-10 ENCOUNTER — Other Ambulatory Visit: Payer: Self-pay

## 2020-03-10 ENCOUNTER — Encounter: Payer: Self-pay | Admitting: Podiatry

## 2020-03-10 DIAGNOSIS — E1142 Type 2 diabetes mellitus with diabetic polyneuropathy: Secondary | ICD-10-CM

## 2020-03-10 DIAGNOSIS — M2140 Flat foot [pes planus] (acquired), unspecified foot: Secondary | ICD-10-CM | POA: Diagnosis not present

## 2020-03-10 DIAGNOSIS — M7752 Other enthesopathy of left foot: Secondary | ICD-10-CM | POA: Diagnosis not present

## 2020-03-10 DIAGNOSIS — M778 Other enthesopathies, not elsewhere classified: Secondary | ICD-10-CM | POA: Diagnosis not present

## 2020-03-10 DIAGNOSIS — M2141 Flat foot [pes planus] (acquired), right foot: Secondary | ICD-10-CM

## 2020-03-10 DIAGNOSIS — M7751 Other enthesopathy of right foot: Secondary | ICD-10-CM | POA: Diagnosis not present

## 2020-03-10 DIAGNOSIS — M2142 Flat foot [pes planus] (acquired), left foot: Secondary | ICD-10-CM | POA: Diagnosis not present

## 2020-03-10 MED ORDER — TRIAMCINOLONE ACETONIDE 10 MG/ML IJ SUSP
10.0000 mg | Freq: Once | INTRAMUSCULAR | Status: AC
  Administered 2020-03-10: 10 mg via INTRA_ARTICULAR

## 2020-03-10 NOTE — Progress Notes (Signed)
Subjective:  Patient ID: Selena Contreras, female    DOB: 1945-04-04,  MRN: [id number]  Chief Complaint  Patient presents with  . Foot Pain    Patient presents today for bilat foot pain/swelling, left worse than right    74 y.o. female presents with the above complaint.  Patient presents with complaint of dorsal forefoot pain left greater than right side.  The right is very mild.  However she states that left side has been hurting a lot.  She had a old sprain injury that may have caused the aggravation.  She states been hurting it gets worse at night.  She would like to discuss treatment options.  She has not seen anyone else prior to see me for this possible sprain.   Review of Systems: Negative except as noted in the HPI. Denies N/V/F/Ch.  Past Medical History:  Diagnosis Date  . Anemia   . Arthritis    hips, legs  . Back pain   . Breast cancer ([place]) 2010   LUMPECTOMY/ chemo/rad  . Cancer [facility]) February 24,2010   Left breast:  T1 C., N0, M0, triple negative treated with wide excision, mastoplasty and sentinel node biopsy in February 2010  . Cataracts, both eyes   . Complication of anesthesia    hard to put to sleep needs more  . Diabetes mellitus without complication (HCC)    AGE 56  . Glaucoma   . Glaucoma   . Hollenhorst plaque, both eyes   . Hypertension   . Morbid obesity ([place])   . Neuropathy 2010  . Personal history of chemotherapy   . Personal history of radiation therapy   . S/P chemotherapy, time since greater than 12 weeks    left breast  . S/P radiation therapy > 12 wks ago    left breast cancer 2010  . Ulcer    STOMACH    Current Outpatient Medications:  .  acetaminophen (TYLENOL) 500 MG tablet, Take 500 mg by mouth every 6 (six) hours as needed., Disp: , Rfl:  .  aspirin 81 MG tablet, Take 81 mg by mouth daily., Disp: , Rfl:  .  calcium carbonate (OS-CAL) 600 MG TABS, Take 600 mg by mouth 2 (two) times daily with a meal., Disp: , Rfl:  .  Ferrous  Sulfate (IRON) 325 (65 FE) MG TABS, Take 1 tablet by mouth daily., Disp: , Rfl:  .  fexofenadine-pseudoephedrine (ALLEGRA-D 24) 180-240 MG 24 hr tablet, Take 1 tablet by mouth daily as needed., Disp: , Rfl:  .  fish oil-omega-3 fatty acids 1000 MG capsule, Take 2 g by mouth daily., Disp: , Rfl:  .  fluticasone (FLONASE) 50 MCG/ACT nasal spray, Place 2 sprays into both nostrils daily. As needed, Disp: , Rfl:  .  furosemide (LASIX) 20 MG tablet, , Disp: , Rfl:  .  glipiZIDE (GLUCOTROL) 10 MG tablet, TAKE 1/2 TABLETS (5 MG TOTAL) BY MOUTH 2 (TWO) TIMES DAILY BEFORE MEALS, Disp: , Rfl:  .  latanoprost (XALATAN) 0.005 % ophthalmic solution, Place 1 drop into both eyes at bedtime., Disp: , Rfl:  .  Multiple Vitamin (MULTIVITAMIN) capsule, Take 1 capsule by mouth daily., Disp: , Rfl:  .  rosuvastatin (CRESTOR) 10 MG tablet, Take by mouth., Disp: , Rfl:  .  sitaGLIPtin-metformin (JANUMET) 50-1000 MG tablet, Take 1 tablet by mouth 2 (two) times daily with a meal., Disp: , Rfl:  .  solifenacin (VESICARE) 5 MG tablet, , Disp: , Rfl:  .  timolol (TIMOPTIC)  0.5 % ophthalmic solution, INSTILL 1 DROP INTO BOTH EYES TWICE DAILY, Disp: , Rfl:   Social History   Tobacco Use  Smoking Status Never Smoker  Smokeless Tobacco Never Used    Allergies  Allergen Reactions  . Brimonidine     Burning sensation in eyes, redness  . Dorzolamide     Burning sensation in eyes, redness  . Flagyl [Metronidazole]     Doesn't remember reaction  . Lisinopril Cough  . Naprosyn [Naproxen]     UNKNOWN   . Tegaderm Ag Mesh [Silver]     Skin irritation   . Doxycycline Other (See Comments)    fatigue per pt  . Tape Rash    Skin irritation. PAPER OK TO USE    Objective:  There were no vitals filed for this visit. There is no height or weight on file to calculate BMI. Constitutional Well developed. Well nourished.  Vascular Dorsalis pedis pulses palpable bilaterally. Posterior tibial pulses palpable  bilaterally. Capillary refill normal to all digits.  No cyanosis or clubbing noted. Pedal hair growth normal.  Neurologic Normal speech. Oriented to person, place, and time. Epicritic sensation to light touch grossly present bilaterally.  Dermatologic Nails well groomed and normal in appearance. No open wounds. No skin lesions.  Orthopedic:  Pain on palpation to the dorsal forefoot at the level of fourth and fifth digit.  Negative Mulder's click.  Mild pain with resisted dorsiflexion of the digits.  No pain with resisted plantarflexion of the digit.   Radiographs: 3 views of skeletally mature adult bilateral foot: There is decreasing calcaneal inclination angle increase in talar declination angle anterior break in the cyma line.  Midfoot arthrosis noted.  Arthritis in the first metatarsophalangeal joint noted.  Pes planovalgus foot structure noted.  No fractures noted. Assessment:   1. Pes planus, unspecified laterality   2. Diabetic polyneuropathy associated with type 2 diabetes mellitus ([place])   3. Extensor tendinitis of foot   4. Capsulitis of left foot    Plan:  Patient was evaluated and treated and all questions answered.  Left dorsal extensor tendinitis -I explained to the patient the etiology of extensor tendinitis of wrist treatment options were discussed.  I believe patient would benefit from steroid injection to help decrease the acute inflammatory component associated pain.  Given that this will be near the tendinitis discussed with the patient that there is risk of rupture associated with it patient would like to proceed despite the risk of rupture. -A steroid injection was performed at left dorsal forefoot using 1% plain Lidocaine and 10 mg of Kenalog. This was well tolerated.   No follow-ups on file.

## 2020-04-12 ENCOUNTER — Ambulatory Visit: Payer: Medicare HMO | Admitting: Podiatry

## 2020-04-12 DIAGNOSIS — R0600 Dyspnea, unspecified: Secondary | ICD-10-CM | POA: Diagnosis not present

## 2020-04-12 DIAGNOSIS — E119 Type 2 diabetes mellitus without complications: Secondary | ICD-10-CM | POA: Diagnosis not present

## 2020-04-12 DIAGNOSIS — I10 Essential (primary) hypertension: Secondary | ICD-10-CM | POA: Diagnosis not present

## 2020-04-12 DIAGNOSIS — R600 Localized edema: Secondary | ICD-10-CM | POA: Diagnosis not present

## 2020-04-12 DIAGNOSIS — E782 Mixed hyperlipidemia: Secondary | ICD-10-CM | POA: Diagnosis not present

## 2020-04-12 DIAGNOSIS — Z853 Personal history of malignant neoplasm of breast: Secondary | ICD-10-CM | POA: Diagnosis not present

## 2020-04-12 DIAGNOSIS — I208 Other forms of angina pectoris: Secondary | ICD-10-CM | POA: Diagnosis not present

## 2020-05-24 DIAGNOSIS — I208 Other forms of angina pectoris: Secondary | ICD-10-CM | POA: Diagnosis not present

## 2020-05-24 DIAGNOSIS — R0600 Dyspnea, unspecified: Secondary | ICD-10-CM | POA: Diagnosis not present

## 2020-05-31 DIAGNOSIS — R600 Localized edema: Secondary | ICD-10-CM | POA: Diagnosis not present

## 2020-05-31 DIAGNOSIS — E119 Type 2 diabetes mellitus without complications: Secondary | ICD-10-CM | POA: Diagnosis not present

## 2020-05-31 DIAGNOSIS — R0600 Dyspnea, unspecified: Secondary | ICD-10-CM | POA: Diagnosis not present

## 2020-05-31 DIAGNOSIS — N644 Mastodynia: Secondary | ICD-10-CM | POA: Diagnosis not present

## 2020-05-31 DIAGNOSIS — E782 Mixed hyperlipidemia: Secondary | ICD-10-CM | POA: Diagnosis not present

## 2020-05-31 DIAGNOSIS — I10 Essential (primary) hypertension: Secondary | ICD-10-CM | POA: Diagnosis not present

## 2020-05-31 DIAGNOSIS — I208 Other forms of angina pectoris: Secondary | ICD-10-CM | POA: Diagnosis not present

## 2020-07-13 DIAGNOSIS — E785 Hyperlipidemia, unspecified: Secondary | ICD-10-CM | POA: Diagnosis not present

## 2020-07-13 DIAGNOSIS — E1140 Type 2 diabetes mellitus with diabetic neuropathy, unspecified: Secondary | ICD-10-CM | POA: Diagnosis not present

## 2020-07-13 DIAGNOSIS — I10 Essential (primary) hypertension: Secondary | ICD-10-CM | POA: Diagnosis not present

## 2020-07-20 DIAGNOSIS — Z6841 Body Mass Index (BMI) 40.0 and over, adult: Secondary | ICD-10-CM | POA: Diagnosis not present

## 2020-07-20 DIAGNOSIS — Z0000 Encounter for general adult medical examination without abnormal findings: Secondary | ICD-10-CM | POA: Diagnosis not present

## 2020-07-20 DIAGNOSIS — E1122 Type 2 diabetes mellitus with diabetic chronic kidney disease: Secondary | ICD-10-CM | POA: Diagnosis not present

## 2020-07-20 DIAGNOSIS — I129 Hypertensive chronic kidney disease with stage 1 through stage 4 chronic kidney disease, or unspecified chronic kidney disease: Secondary | ICD-10-CM | POA: Diagnosis not present

## 2020-07-20 DIAGNOSIS — C50812 Malignant neoplasm of overlapping sites of left female breast: Secondary | ICD-10-CM | POA: Diagnosis not present

## 2020-07-20 DIAGNOSIS — E1142 Type 2 diabetes mellitus with diabetic polyneuropathy: Secondary | ICD-10-CM | POA: Diagnosis not present

## 2020-07-20 DIAGNOSIS — Z23 Encounter for immunization: Secondary | ICD-10-CM | POA: Diagnosis not present

## 2020-07-20 DIAGNOSIS — N1831 Chronic kidney disease, stage 3a: Secondary | ICD-10-CM | POA: Diagnosis not present

## 2020-08-05 DIAGNOSIS — H401123 Primary open-angle glaucoma, left eye, severe stage: Secondary | ICD-10-CM | POA: Diagnosis not present

## 2020-08-05 DIAGNOSIS — H2511 Age-related nuclear cataract, right eye: Secondary | ICD-10-CM | POA: Diagnosis not present

## 2020-08-05 DIAGNOSIS — Z961 Presence of intraocular lens: Secondary | ICD-10-CM | POA: Diagnosis not present

## 2020-08-05 DIAGNOSIS — H401112 Primary open-angle glaucoma, right eye, moderate stage: Secondary | ICD-10-CM | POA: Diagnosis not present

## 2020-08-05 IMAGING — MG MM DIGITAL DIAGNOSTIC UNILAT*L* W/ TOMO W/ CAD
6 series · 6 of 18 positions shown · non-contrast
Comparison: Previous exam(s).

CLINICAL DATA: Recall from screening mammography tomosynthesis,
possible asymmetry and distortion in the far POSTERIOR LEFT breast
visible only on the MLO images. Personal history of malignant LEFT
breast lumpectomy in the UPPER OUTER QUADRANT in 1858.

EXAM:
DIGITAL DIAGNOSTIC UNILATERAL LEFT MAMMOGRAM WITH TOMO AND CAD

[L ML synth-2D (1 of 2)]
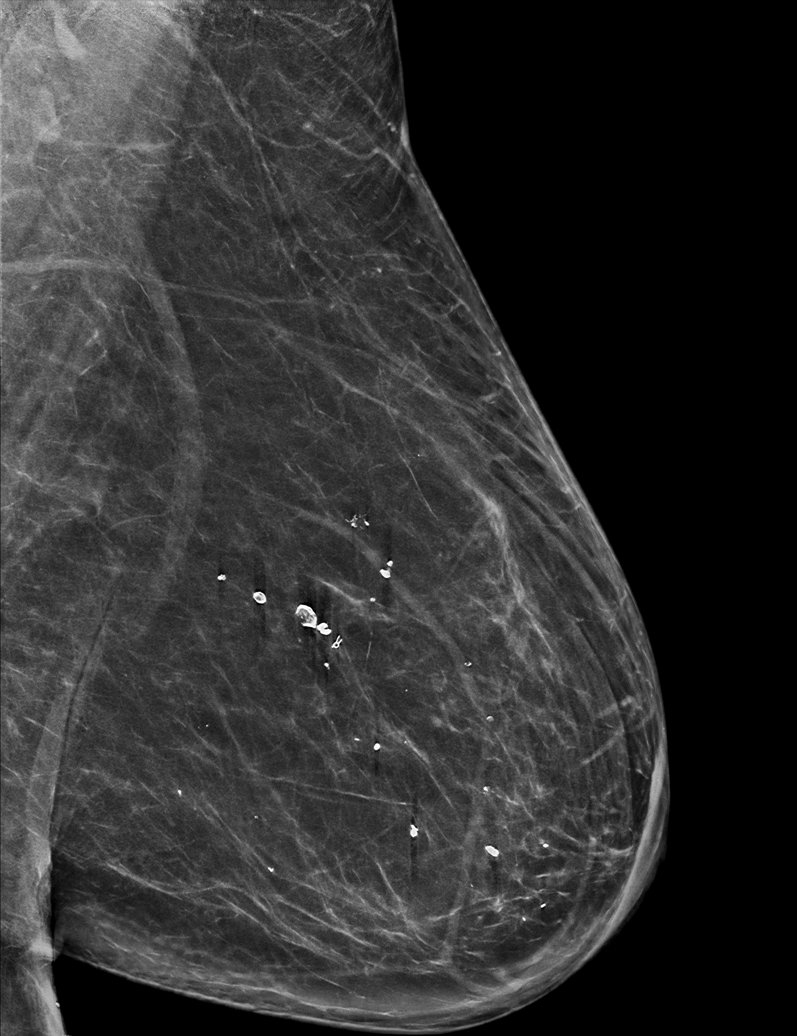

[L MLO synth-2D]
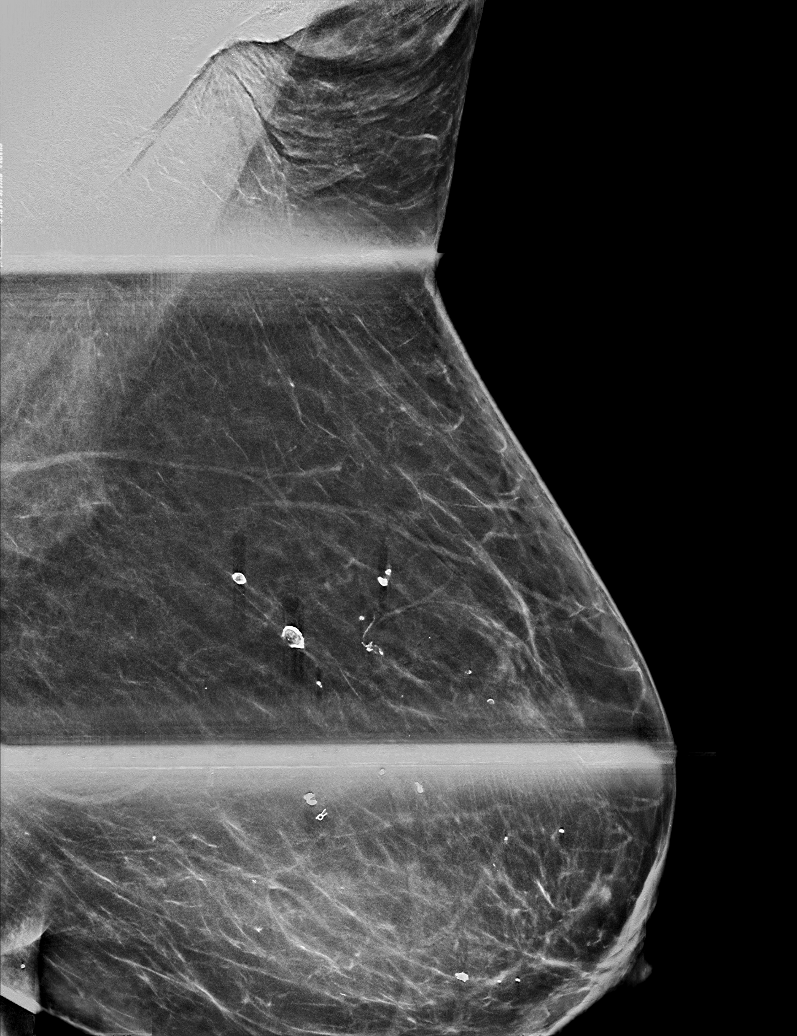

[L ML synth-2D (2 of 2)]
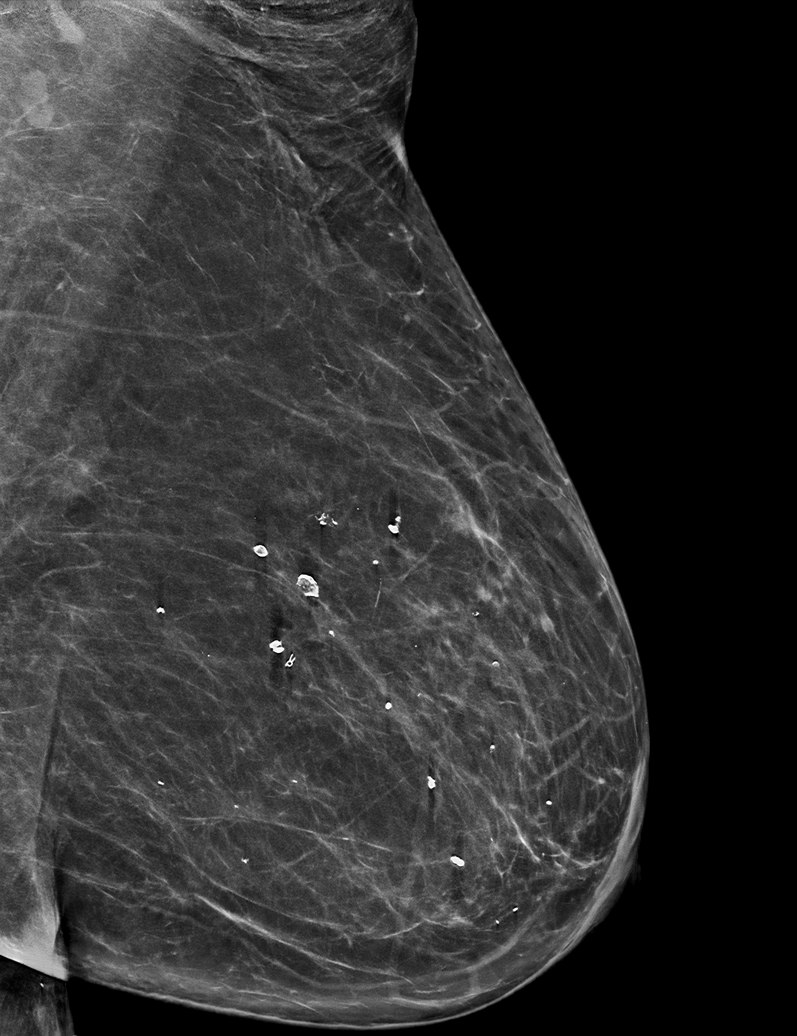

[L ML tomo (1 of 2) · tomo slice 36/71.0]
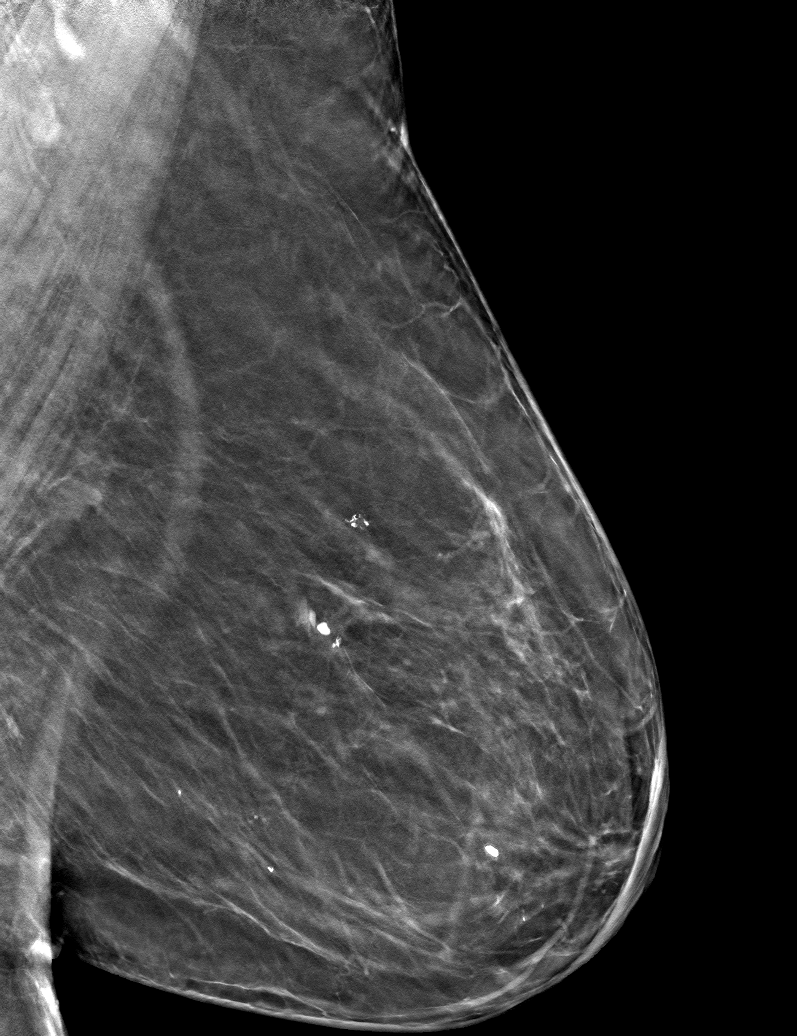

[L MLO tomo · tomo slice 34/67.0]
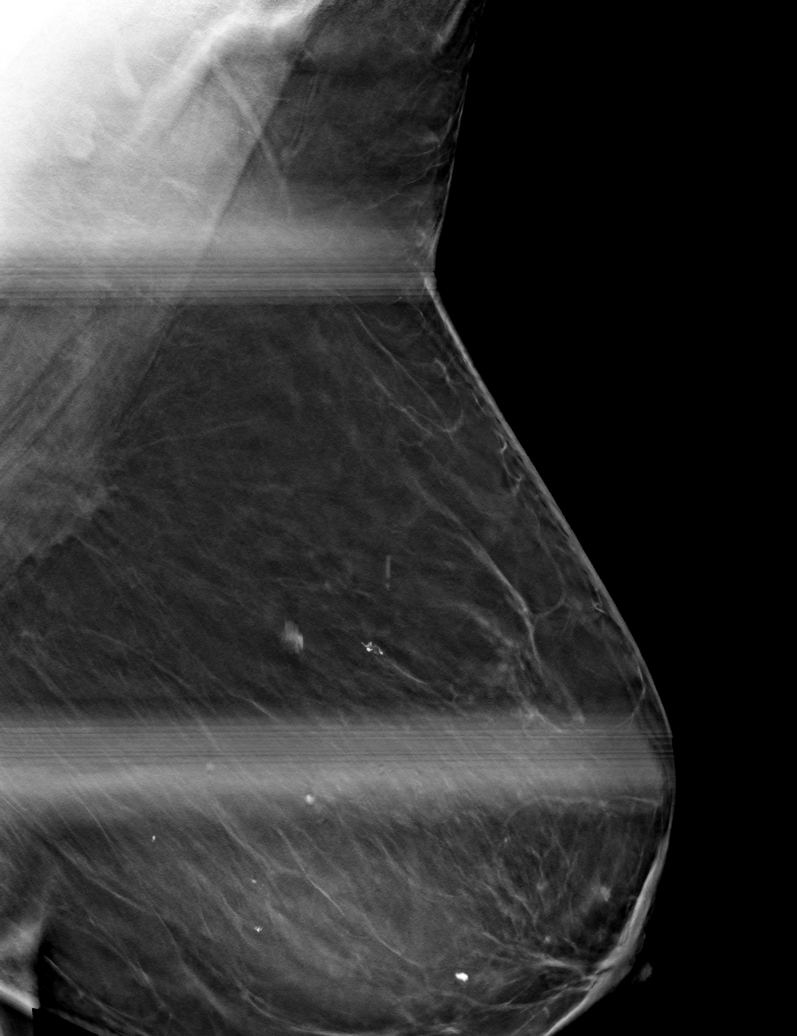

[L ML tomo (2 of 2) · tomo slice 34/67.0]
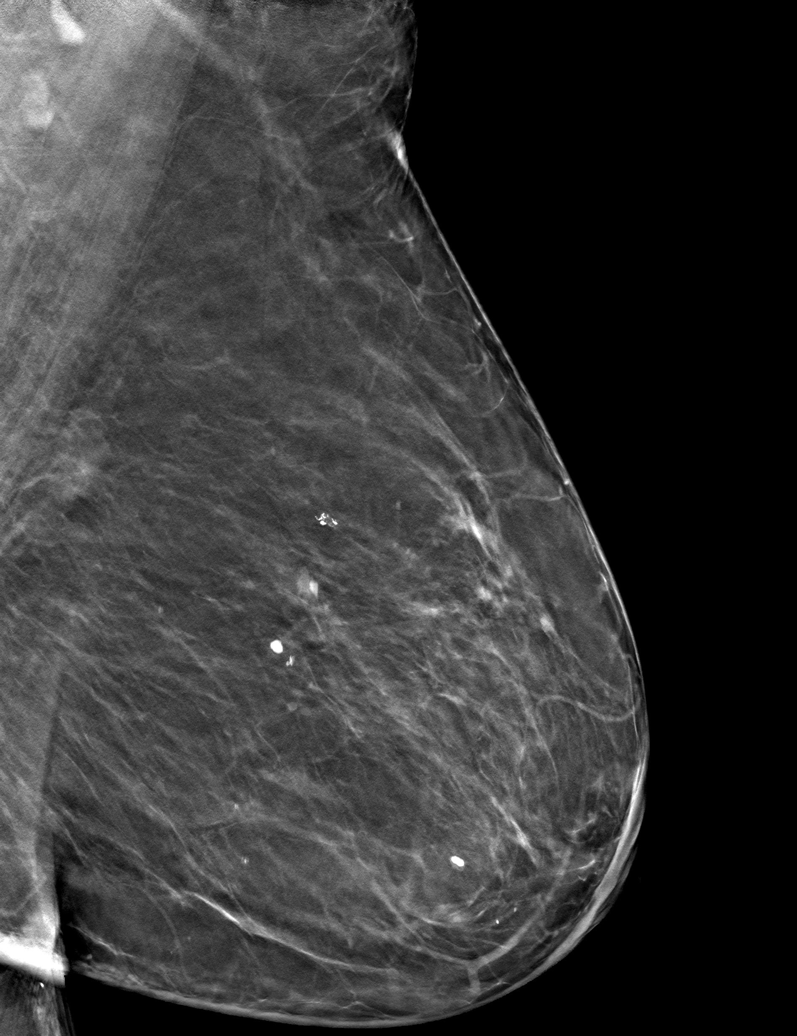

[6 of 18 positions shown; findings below may reference images not displayed]

ACR Breast Density Category b: There are scattered areas of
fibroglandular density.
FINDINGS: Tomosynthesis and synthesized spot-compression MLO view of the area
of concern in the LEFT breast and a tomosynthesis and synthesized
full field mediolateral view of the LEFT breast were obtained.

The partially visualized asymmetry and distortion in the far
POSTERIOR location on the screening mammogram is felt to correspond
to the patient's surgical scar, as it was vaguely visible on prior
mammograms as well. The questioned asymmetry and distortion is in
the OUTER breast, as it is located inferiorly on the full field
mediolateral image relative to its location on the MLO image. The
associated asymmetry disperses with compression and there is no
underlying mass.

No suspicious findings elsewhere in the LEFT breast.

The full field mediolateral image was processed with CAD.

On correlative physical examination, there is a scar in the low LEFT
axilla/far OUTER LEFT breast, related to the prior node excision at
the time of lumpectomy, which correlates with the mammographic
finding.
IMPRESSION: No mammographic evidence of malignancy involving the LEFT breast.

RECOMMENDATION:
Screening mammogram in one year.(Code:TI-C-K9M)

I have discussed the findings and recommendations with the patient.
If applicable, a reminder letter will be sent to the patient
regarding the next appointment.

BI-RADS CATEGORY  2: Benign.

## 2020-08-12 ENCOUNTER — Other Ambulatory Visit: Payer: Self-pay | Admitting: General Surgery

## 2020-08-12 DIAGNOSIS — R928 Other abnormal and inconclusive findings on diagnostic imaging of breast: Secondary | ICD-10-CM

## 2020-08-16 ENCOUNTER — Ambulatory Visit: Payer: Medicare HMO | Admitting: Internal Medicine

## 2020-08-16 ENCOUNTER — Other Ambulatory Visit: Payer: Medicare HMO

## 2020-09-07 DIAGNOSIS — I10 Essential (primary) hypertension: Secondary | ICD-10-CM | POA: Diagnosis not present

## 2020-09-07 DIAGNOSIS — R0600 Dyspnea, unspecified: Secondary | ICD-10-CM | POA: Diagnosis not present

## 2020-09-07 DIAGNOSIS — M79672 Pain in left foot: Secondary | ICD-10-CM | POA: Diagnosis not present

## 2020-09-07 DIAGNOSIS — R600 Localized edema: Secondary | ICD-10-CM | POA: Diagnosis not present

## 2020-09-07 DIAGNOSIS — E782 Mixed hyperlipidemia: Secondary | ICD-10-CM | POA: Diagnosis not present

## 2020-09-07 DIAGNOSIS — E119 Type 2 diabetes mellitus without complications: Secondary | ICD-10-CM | POA: Diagnosis not present

## 2020-09-07 DIAGNOSIS — I208 Other forms of angina pectoris: Secondary | ICD-10-CM | POA: Diagnosis not present

## 2020-09-21 DIAGNOSIS — M2142 Flat foot [pes planus] (acquired), left foot: Secondary | ICD-10-CM | POA: Diagnosis not present

## 2020-09-21 DIAGNOSIS — M2141 Flat foot [pes planus] (acquired), right foot: Secondary | ICD-10-CM | POA: Diagnosis not present

## 2020-09-21 DIAGNOSIS — M19072 Primary osteoarthritis, left ankle and foot: Secondary | ICD-10-CM | POA: Diagnosis not present

## 2020-09-21 DIAGNOSIS — M19071 Primary osteoarthritis, right ankle and foot: Secondary | ICD-10-CM | POA: Diagnosis not present

## 2020-09-21 DIAGNOSIS — M79672 Pain in left foot: Secondary | ICD-10-CM | POA: Diagnosis not present

## 2020-09-21 DIAGNOSIS — E1140 Type 2 diabetes mellitus with diabetic neuropathy, unspecified: Secondary | ICD-10-CM | POA: Diagnosis not present

## 2020-09-21 DIAGNOSIS — Z6841 Body Mass Index (BMI) 40.0 and over, adult: Secondary | ICD-10-CM | POA: Diagnosis not present

## 2020-09-26 ENCOUNTER — Other Ambulatory Visit: Payer: Self-pay

## 2020-09-26 ENCOUNTER — Ambulatory Visit
Admission: RE | Admit: 2020-09-26 | Discharge: 2020-09-26 | Disposition: A | Discharge status: Home / Self Care | Payer: Medicare HMO | Source: Ambulatory Visit | Attending: General Surgery | Admitting: General Surgery

## 2020-09-26 DIAGNOSIS — Z1231 Encounter for screening mammogram for malignant neoplasm of breast: Secondary | ICD-10-CM | POA: Insufficient documentation

## 2020-09-26 DIAGNOSIS — R928 Other abnormal and inconclusive findings on diagnostic imaging of breast: Secondary | ICD-10-CM | POA: Insufficient documentation

## 2020-09-28 ENCOUNTER — Encounter: Payer: Self-pay | Admitting: Internal Medicine

## 2020-10-04 DIAGNOSIS — K635 Polyp of colon: Secondary | ICD-10-CM | POA: Diagnosis not present

## 2020-10-04 DIAGNOSIS — Z853 Personal history of malignant neoplasm of breast: Secondary | ICD-10-CM | POA: Diagnosis not present

## 2020-12-08 DIAGNOSIS — R600 Localized edema: Secondary | ICD-10-CM | POA: Diagnosis not present

## 2020-12-08 DIAGNOSIS — E119 Type 2 diabetes mellitus without complications: Secondary | ICD-10-CM | POA: Diagnosis not present

## 2020-12-08 DIAGNOSIS — I8393 Asymptomatic varicose veins of bilateral lower extremities: Secondary | ICD-10-CM | POA: Diagnosis not present

## 2021-01-16 DIAGNOSIS — I10 Essential (primary) hypertension: Secondary | ICD-10-CM | POA: Diagnosis not present

## 2021-01-16 DIAGNOSIS — E1140 Type 2 diabetes mellitus with diabetic neuropathy, unspecified: Secondary | ICD-10-CM | POA: Diagnosis not present

## 2021-01-16 DIAGNOSIS — E785 Hyperlipidemia, unspecified: Secondary | ICD-10-CM | POA: Diagnosis not present

## 2021-01-23 DIAGNOSIS — E1142 Type 2 diabetes mellitus with diabetic polyneuropathy: Secondary | ICD-10-CM | POA: Diagnosis not present

## 2021-01-23 DIAGNOSIS — Z0000 Encounter for general adult medical examination without abnormal findings: Secondary | ICD-10-CM | POA: Diagnosis not present

## 2021-01-23 DIAGNOSIS — E785 Hyperlipidemia, unspecified: Secondary | ICD-10-CM | POA: Diagnosis not present

## 2021-01-23 DIAGNOSIS — Z6841 Body Mass Index (BMI) 40.0 and over, adult: Secondary | ICD-10-CM | POA: Diagnosis not present

## 2021-01-23 DIAGNOSIS — Z1211 Encounter for screening for malignant neoplasm of colon: Secondary | ICD-10-CM | POA: Diagnosis not present

## 2021-01-23 DIAGNOSIS — Z853 Personal history of malignant neoplasm of breast: Secondary | ICD-10-CM | POA: Diagnosis not present

## 2021-01-23 DIAGNOSIS — D631 Anemia in chronic kidney disease: Secondary | ICD-10-CM | POA: Diagnosis not present

## 2021-01-23 DIAGNOSIS — I129 Hypertensive chronic kidney disease with stage 1 through stage 4 chronic kidney disease, or unspecified chronic kidney disease: Secondary | ICD-10-CM | POA: Diagnosis not present

## 2021-02-15 ENCOUNTER — Inpatient Hospital Stay: Payer: Medicare HMO | Attending: Internal Medicine

## 2021-02-15 ENCOUNTER — Other Ambulatory Visit: Payer: Self-pay

## 2021-02-15 ENCOUNTER — Inpatient Hospital Stay: Payer: Medicare HMO | Admitting: Internal Medicine

## 2021-02-15 DIAGNOSIS — Z171 Estrogen receptor negative status [ER-]: Secondary | ICD-10-CM | POA: Insufficient documentation

## 2021-02-15 DIAGNOSIS — D649 Anemia, unspecified: Secondary | ICD-10-CM | POA: Diagnosis not present

## 2021-02-15 DIAGNOSIS — E1140 Type 2 diabetes mellitus with diabetic neuropathy, unspecified: Secondary | ICD-10-CM | POA: Diagnosis not present

## 2021-02-15 DIAGNOSIS — M8580 Other specified disorders of bone density and structure, unspecified site: Secondary | ICD-10-CM | POA: Insufficient documentation

## 2021-02-15 DIAGNOSIS — Z803 Family history of malignant neoplasm of breast: Secondary | ICD-10-CM | POA: Insufficient documentation

## 2021-02-15 DIAGNOSIS — C50812 Malignant neoplasm of overlapping sites of left female breast: Secondary | ICD-10-CM | POA: Diagnosis not present

## 2021-02-15 DIAGNOSIS — I10 Essential (primary) hypertension: Secondary | ICD-10-CM | POA: Diagnosis not present

## 2021-02-15 LAB — CBC WITH DIFFERENTIAL/PLATELET
Abs Immature Granulocytes: 0.02 10*3/uL (ref 0.00–0.07)
Basophils Absolute: 0 10*3/uL (ref 0.0–0.1)
Basophils Relative: 1 %
Eosinophils Absolute: 0.4 10*3/uL (ref 0.0–0.5)
Eosinophils Relative: 5 %
HCT: 38.1 % (ref 36.0–46.0)
Hemoglobin: 11.8 g/dL — ABNORMAL LOW (ref 12.0–15.0)
Immature Granulocytes: 0 %
Lymphocytes Relative: 33 %
Lymphs Abs: 2.6 10*3/uL (ref 0.7–4.0)
MCH: 27.1 pg (ref 26.0–34.0)
MCHC: 31 g/dL (ref 30.0–36.0)
MCV: 87.4 fL (ref 80.0–100.0)
Monocytes Absolute: 0.8 10*3/uL (ref 0.1–1.0)
Monocytes Relative: 10 %
Neutro Abs: 4.2 10*3/uL (ref 1.7–7.7)
Neutrophils Relative %: 51 %
Platelets: 238 10*3/uL (ref 150–400)
RBC: 4.36 MIL/uL (ref 3.87–5.11)
RDW: 14.1 % (ref 11.5–15.5)
WBC: 8.1 10*3/uL (ref 4.0–10.5)
nRBC: 0 % (ref 0.0–0.2)

## 2021-02-15 LAB — COMPREHENSIVE METABOLIC PANEL
ALT: 16 U/L (ref 0–44)
AST: 20 U/L (ref 15–41)
Albumin: 3.8 g/dL (ref 3.5–5.0)
Alkaline Phosphatase: 73 U/L (ref 38–126)
Anion gap: 11 (ref 5–15)
BUN: 35 mg/dL — ABNORMAL HIGH (ref 8–23)
CO2: 23 mmol/L (ref 22–32)
Calcium: 9.1 mg/dL (ref 8.9–10.3)
Chloride: 100 mmol/L (ref 98–111)
Creatinine, Ser: 1.53 mg/dL — ABNORMAL HIGH (ref 0.44–1.00)
GFR, Estimated: 35 mL/min — ABNORMAL LOW (ref >60)
Glucose, Bld: 150 mg/dL — ABNORMAL HIGH (ref 70–99)
Potassium: 4.4 mmol/L (ref 3.5–5.1)
Sodium: 134 mmol/L — ABNORMAL LOW (ref 135–145)
Total Bilirubin: 0.3 mg/dL (ref 0.3–1.2)
Total Protein: 7.5 g/dL (ref 6.5–8.1)

## 2021-02-15 NOTE — Patient Instructions (Signed)
#  Stop taking Lasix for 2 weeks; drink plenty of fluids.  Meanwhile reach out to PCP regarding reevaluation of the kidney numbers in 2 to 3 weeks.

## 2021-02-15 NOTE — Assessment & Plan Note (Addendum)
#   LEFT BREAST -  Stage I triple breast cancer status post lumpectomy; radiation.  STABLE; mammo June 7th 2022- WNL. Ca-27-29-pending  #Chronic left breast neuropathic pain- STABLE.   # chronic Anemia-unclear etiology.  On p.o. iron.  Hemoglobin between 10-11.  STABLE.  # Rising creatinine- 1.5/GFR-35; baseline 58 [HbA1c- 7.4]; recommend holding off lasix; #Stop taking Lasix for 2 weeks; drink plenty of fluids.  Meanwhile reach out to PCP regarding reevaluation of the kidney numbers in 2 to 3 weeks. Also avoid nephrotoxins.   # BMD-April 2021- Osteopenia [[facility]]- On ca+vitD- continue exercise  # DISPOSITION: # follow up in 12 months-MD- cbc/ cmp/ ca-27-29-Dr.B.   CC: [name].

## 2021-02-15 NOTE — Progress Notes (Signed)
Patient reports that she has "smelly urine" that smells like "perfume" for a couple of weeks.

## 2021-02-15 NOTE — Progress Notes (Signed)
[place] OFFICE PROGRESS NOTE  Patient Care Team: Marinda Elk, MD as PCP - General (Physician Assistant) Bary Castilla Forest Gleason, MD as Consulting Physician (General Surgery) Cammie Sickle, MD as Consulting Physician (Internal Medicine)  Cancer Staging No matching staging information was found for the patient.   Oncology History Overview Note  # FEB 2010- Left breast:  T1 C., N0, M0, triple negative treated with wide excisions/p Lumpectomy and sentinel node biopsy. [Dr.Byrnett]; s/p Adj Chemo [Dr.Yabanez]; s/p RT;   # chronic anemia [at least 2013]- ? Sep 2017;[facility]; again in 5 years.   # CKD [creat 1.18/ DM-2 poorly controlled]   DIAGNOSIS: Left breast cancer  STAGE:  I       ;GOALS: cure  CURRENT/MOST RECENT THERAPY: surveillaince    Breast cancer ([place])  05/26/2008 Initial Diagnosis   Breast cancer (HCC)   Carcinoma of overlapping sites of left breast in female, estrogen receptor negative ([place])    INTERVAL HISTORY:  Clemencia Course 75 y.o.  female pleasant patient above history of  Stage I breast cancer is here for a follow-up.  Recently started back on Lasix for leg swelling.  States her diabetes is not well controlled A1c around 7.4.  Denies any nausea vomiting.  Denies any fevers or chills.  No new lumps or bumps.  No headaches.   Review of Systems  Constitutional:  Negative for chills, diaphoresis, fever, malaise/fatigue and weight loss.  HENT:  Negative for nosebleeds and sore throat.   Eyes:  Negative for double vision.  Respiratory:  Negative for cough, hemoptysis, sputum production, shortness of breath and wheezing.   Cardiovascular:  Negative for chest pain, palpitations, orthopnea and leg swelling.  Gastrointestinal:  Negative for abdominal pain, blood in stool, constipation, diarrhea, heartburn, melena, nausea and vomiting.  Genitourinary:  Negative for dysuria, frequency and urgency.  Musculoskeletal:  Positive for  back pain and neck pain. Negative for joint pain.  Skin: Negative.  Negative for itching and rash.  Neurological:  Negative for dizziness, tingling, focal weakness, weakness and headaches.  Endo/Heme/Allergies:  Does not bruise/bleed easily.  Psychiatric/Behavioral:  Negative for depression. The patient is not nervous/anxious and does not have insomnia.    PAST MEDICAL HISTORY :  Past Medical History:  Diagnosis Date   Anemia    Arthritis    hips, legs   Back pain    Breast cancer ([place]) 2010   LUMPECTOMY/ chemo/rad   Cancer [facility]) February 24,2010   Left breast:  T1 C., N0, M0, triple negative treated with wide excision, mastoplasty and sentinel node biopsy in February 2010   Cataracts, both eyes    Complication of anesthesia    hard to put to sleep needs more   Diabetes mellitus without complication ([place])    AGE 38   Glaucoma    Glaucoma    Hollenhorst plaque, both eyes    Hypertension    Morbid obesity ([place])    Neuropathy 2010   Personal history of chemotherapy    Personal history of radiation therapy    S/P chemotherapy, time since greater than 12 weeks    left breast   S/P radiation therapy > 12 wks ago    left breast cancer 2010   Ulcer    STOMACH    PAST SURGICAL HISTORY :   Past Surgical History:  Procedure Laterality Date   BREAST BIOPSY Left    negative 2007   BREAST BIOPSY Right 07/2015   complex sclerosing lesion  BREAST BIOPSY Right 07/27/2015   COMPLEX SCLEROSING LESION WITH USUAL DUCTAL HYPERPLASIA AND CALCIFICATIONS   BREAST CYST ASPIRATION Right    2002   BREAST EXCISIONAL BIOPSY Left    positive 2010   BREAST LUMPECTOMY Left    left breast cancer   BREAST SURGERY Left 2010   LEFT BREAST WIDE EXCISION   COLONOSCOPY  2012, 12/16/2015   [place]   COLONOSCOPY WITH PROPOFOL N/A 12/16/2015   Procedure: COLONOSCOPY WITH PROPOFOL;  Surgeon: Manya Silvas, MD;  Location: [facility];  Service: Endoscopy;  Laterality: N/A;   DILATATION &  CURETTAGE/HYSTEROSCOPY WITH MYOSURE N/A 07/01/2015   Procedure: DILATATION & CURETTAGE/HYSTEROSCOPY WITH MYOSURE;  Surgeon: Boykin Nearing, MD;  Location: [facility];  Service: Gynecology;  Laterality: N/A;   DILATION AND CURETTAGE OF UTERUS     EYE SURGERY     FOR GLAUCOMA   PHOTOCOAGULATION Left 03/19/2016   Procedure: PHOTOCOAGULATION;  Surgeon: Ronnell Freshwater, MD;  Location: [facility];  Service: Ophthalmology;  Laterality: Left;  Diabetic - oral meds CANNOT arrive before 8AM IV[facility] Block   SENTINEL LYMPH NODE BIOPSY Left 2010   TOE SURGERY      FAMILY HISTORY :   Family History  Problem Relation Age of Onset   Breast cancer Sister 73   Breast cancer Sister 74   Breast cancer Other     SOCIAL HISTORY:   Social History   Tobacco Use   Smoking status: Never   Smokeless tobacco: Never  Substance Use Topics   Alcohol use: No   Drug use: No    ALLERGIES:  is allergic to brimonidine, dorzolamide, flagyl [metronidazole], lisinopril, naprosyn [naproxen], tegaderm ag mesh [silver], doxycycline, and tape.  MEDICATIONS:  Current Outpatient Medications  Medication Sig Dispense Refill   acetaminophen (TYLENOL) 500 MG tablet Take 500 mg by mouth every 6 (six) hours as needed.     aspirin 81 MG tablet Take 81 mg by mouth daily.     calcium carbonate (OS-CAL) 600 MG TABS Take 600 mg by mouth 2 (two) times daily with a meal.     Ferrous Sulfate (IRON) 325 (65 FE) MG TABS Take 1 tablet by mouth daily.     fexofenadine-pseudoephedrine (ALLEGRA-D 24) 180-240 MG 24 hr tablet Take 1 tablet by mouth daily as needed.     fish oil-omega-3 fatty acids 1000 MG capsule Take 2 g by mouth daily.     fluticasone (FLONASE) 50 MCG/ACT nasal spray Place 2 sprays into both nostrils daily. As needed     furosemide (LASIX) 20 MG tablet      glipiZIDE (GLUCOTROL) 5 MG tablet Take by mouth.     latanoprost (XALATAN) 0.005 % ophthalmic solution Place 1 drop into both eyes at bedtime.      Multiple Vitamin (MULTIVITAMIN) capsule Take 1 capsule by mouth daily.     sitaGLIPtin-metformin (JANUMET) 50-1000 MG tablet Take 1 tablet by mouth 2 (two) times daily with a meal.     solifenacin (VESICARE) 5 MG tablet      timolol (TIMOPTIC) 0.5 % ophthalmic solution INSTILL 1 DROP INTO BOTH EYES TWICE DAILY     rosuvastatin (CRESTOR) 10 MG tablet Take by mouth.     No current facility-administered medications for this visit.    PHYSICAL EXAMINATION: ECOG PERFORMANCE STATUS: 0 - Asymptomatic  BP 136/74 (BP Location: Right Arm, Patient Position: Sitting)   Pulse 72   Temp 97.9 F (36.6 C) (Tympanic)   Resp 18   Wt  233 lb (105.7 kg)   SpO2 99%   BMI 42.62 kg/m   Filed Weights   02/15/21 1031  Weight: 233 lb (105.7 kg)    Physical Exam HENT:     Head: Normocephalic and atraumatic.     Mouth/Throat:     Pharynx: No oropharyngeal exudate.  Eyes:     Pupils: Pupils are equal, round, and reactive to light.  Cardiovascular:     Rate and Rhythm: Normal rate and regular rhythm.  Pulmonary:     Effort: No respiratory distress.     Breath sounds: No wheezing.  Abdominal:     General: Bowel sounds are normal. There is no distension.     Palpations: Abdomen is soft. There is no mass.     Tenderness: There is no abdominal tenderness. There is no guarding or rebound.  Musculoskeletal:        General: No tenderness. Normal range of motion.     Cervical back: Normal range of motion and neck supple.  Skin:    General: Skin is warm.     Comments: Right and left BREAST exam [in the presence of nurse]- no unusual skin changes or dominant masses felt. Surgical scars noted.    Neurological:     Mental Status: She is alert and oriented to person, place, and time.  Psychiatric:        Mood and Affect: Affect normal.    Breast exam- defred.  LABORATORY DATA:  I have reviewed the data as listed    Component Value Date/Time   NA 134 (L) 02/15/2021 0948   NA 140 10/01/2011 1349    K 4.4 02/15/2021 0948   K 4.5 10/01/2011 1349   CL 100 02/15/2021 0948   CL 104 10/01/2011 1349   CO2 23 02/15/2021 0948   CO2 27 10/01/2011 1349   GLUCOSE 150 (H) 02/15/2021 0948   GLUCOSE 138 (H) 10/01/2011 1349   BUN 35 (H) 02/15/2021 0948   BUN 16 10/01/2011 1349   CREATININE 1.53 (H) 02/15/2021 0948   CREATININE 0.89 01/06/2014 0938   CALCIUM 9.1 02/15/2021 0948   CALCIUM 10.1 10/01/2011 1349   PROT 7.5 02/15/2021 0948   PROT 7.1 01/06/2014 0938   ALBUMIN 3.8 02/15/2021 0948   ALBUMIN 3.2 (L) 01/06/2014 0938   AST 20 02/15/2021 0948   AST 18 01/06/2014 0938   ALT 16 02/15/2021 0948   ALT 24 01/06/2014 0938   ALKPHOS 73 02/15/2021 0948   ALKPHOS 84 01/06/2014 0938   BILITOT 0.3 02/15/2021 0948   BILITOT 0.3 01/06/2014 0938   GFRNONAA 35 (L) 02/15/2021 0948   GFRNONAA >60 01/06/2014 0938   GFRNONAA 60 (L) 10/01/2012 0912   GFRAA 53 (L) 09/02/2019 1424   GFRAA >60 01/06/2014 0938   GFRAA >60 10/01/2012 0912    No results found for: SPEP, UPEP  Lab Results  Component Value Date   WBC 8.1 02/15/2021   NEUTROABS 4.2 02/15/2021   HGB 11.8 (L) 02/15/2021   HCT 38.1 02/15/2021   MCV 87.4 02/15/2021   PLT 238 02/15/2021      Chemistry      Component Value Date/Time   NA 134 (L) 02/15/2021 0948   NA 140 10/01/2011 1349   K 4.4 02/15/2021 0948   K 4.5 10/01/2011 1349   CL 100 02/15/2021 0948   CL 104 10/01/2011 1349   CO2 23 02/15/2021 0948   CO2 27 10/01/2011 1349   BUN 35 (H) 02/15/2021 0948   BUN 16  10/01/2011 1349   CREATININE 1.53 (H) 02/15/2021 0948   CREATININE 0.89 01/06/2014 0938      Component Value Date/Time   CALCIUM 9.1 02/15/2021 0948   CALCIUM 10.1 10/01/2011 1349   ALKPHOS 73 02/15/2021 0948   ALKPHOS 84 01/06/2014 0938   AST 20 02/15/2021 0948   AST 18 01/06/2014 0938   ALT 16 02/15/2021 0948   ALT 24 01/06/2014 0938   BILITOT 0.3 02/15/2021 0948   BILITOT 0.3 01/06/2014 0938       RADIOGRAPHIC STUDIES: I have personally  reviewed the radiological images as listed and agreed with the findings in the report. No results found.   ASSESSMENT & PLAN:  Carcinoma of overlapping sites of left breast in female, estrogen receptor negative ([place]) # LEFT BREAST -  Stage I triple breast cancer status post lumpectomy; radiation.  STABLE; mammo June 7th 2022- WNL. Ca-27-29-pending  #Chronic left breast neuropathic pain- STABLE.   # chronic Anemia-unclear etiology.  On p.o. iron.  Hemoglobin between 10-11.  STABLE.  # Rising creatinine- 1.5/GFR-35; baseline 58 [HbA1c- 7.4]; recommend holding off lasix; #Stop taking Lasix for 2 weeks; drink plenty of fluids.  Meanwhile reach out to PCP regarding reevaluation of the kidney numbers in 2 to 3 weeks. Also avoid nephrotoxins.   # BMD-April 2021- Osteopenia [[facility]]- On ca+vitD- continue exercise  # DISPOSITION: # follow up in 12 months-MD- cbc/ cmp/ ca-27-29-Dr.B.   CC: Ms. Boykin Reaper.    Orders Placed This Encounter  Procedures   CBC with Differential/Platelet    Standing Status:   Future    Standing Expiration Date:   02/15/2022   Comprehensive metabolic panel    Standing Status:   Future    Standing Expiration Date:   02/15/2022   Cancer antigen 27.29    Standing Status:   Future    Standing Expiration Date:   02/15/2022   All questions were answered. The patient knows to call the clinic with any problems, questions or concerns.      Cammie Sickle, MD 02/15/2021 11:05 AM

## 2021-02-16 LAB — CANCER ANTIGEN 27.29: CA 27.29: 10.5 U/mL (ref 0.0–38.6)

## 2021-02-21 DIAGNOSIS — C50912 Malignant neoplasm of unspecified site of left female breast: Secondary | ICD-10-CM | POA: Diagnosis not present

## 2021-02-28 DIAGNOSIS — C50912 Malignant neoplasm of unspecified site of left female breast: Secondary | ICD-10-CM | POA: Diagnosis not present

## 2021-03-01 ENCOUNTER — Other Ambulatory Visit: Payer: Self-pay | Admitting: Physician Assistant

## 2021-03-01 DIAGNOSIS — E1140 Type 2 diabetes mellitus with diabetic neuropathy, unspecified: Secondary | ICD-10-CM | POA: Diagnosis not present

## 2021-03-01 DIAGNOSIS — N1832 Chronic kidney disease, stage 3b: Secondary | ICD-10-CM | POA: Diagnosis not present

## 2021-03-01 DIAGNOSIS — N3941 Urge incontinence: Secondary | ICD-10-CM | POA: Diagnosis not present

## 2021-03-01 DIAGNOSIS — E1122 Type 2 diabetes mellitus with diabetic chronic kidney disease: Secondary | ICD-10-CM | POA: Diagnosis not present

## 2021-03-01 DIAGNOSIS — N3281 Overactive bladder: Secondary | ICD-10-CM | POA: Diagnosis not present

## 2021-03-01 DIAGNOSIS — I129 Hypertensive chronic kidney disease with stage 1 through stage 4 chronic kidney disease, or unspecified chronic kidney disease: Secondary | ICD-10-CM | POA: Diagnosis not present

## 2021-03-01 DIAGNOSIS — R350 Frequency of micturition: Secondary | ICD-10-CM | POA: Diagnosis not present

## 2021-03-02 ENCOUNTER — Encounter: Payer: Self-pay | Admitting: *Deleted

## 2021-03-02 ENCOUNTER — Other Ambulatory Visit: Payer: Self-pay

## 2021-03-02 ENCOUNTER — Encounter: Payer: Medicare HMO | Attending: Physician Assistant | Admitting: *Deleted

## 2021-03-02 VITALS — BP 132/78 | Ht 61.5 in | Wt 237.1 lb

## 2021-03-02 DIAGNOSIS — E1122 Type 2 diabetes mellitus with diabetic chronic kidney disease: Secondary | ICD-10-CM

## 2021-03-02 DIAGNOSIS — N1831 Chronic kidney disease, stage 3a: Secondary | ICD-10-CM

## 2021-03-02 DIAGNOSIS — E1140 Type 2 diabetes mellitus with diabetic neuropathy, unspecified: Secondary | ICD-10-CM | POA: Diagnosis not present

## 2021-03-02 NOTE — Patient Instructions (Addendum)
Check blood sugars before breakfast or 2 hrs after supper every day Bring blood sugar records to the next appointment  Exercise:  Walk  for    5-10  minutes   5  days a week  Eat 3 meals day,   1  snack a day Space meals 4-6 hours apart Eat at least 1 protein and 1 carbohydrate serving instead of skipping a meal Allow 2-3 hours between meals/snacks Increase water intake to 4-5 servings/day  Complete 3 Day Food Record and bring to next appt  Carry fast acting glucose and a snack at all times  Return for appointment on:   Wednesday April 19, 2021 at 9:30 am with [facility] (dietitian)

## 2021-03-02 NOTE — Progress Notes (Signed)
Diabetes Self-Management Education  Visit Type: First/Initial  Appt. Start Time: 1325 Appt. End Time: 1440  03/02/2021  Ms. Rogene Houston, identified by name and date of birth, is a 75 y.o. female with a diagnosis of Diabetes: Type 2.   ASSESSMENT  Blood pressure 132/78, height 5' 1.5" (1.562 m), weight 237 lb 1.6 oz (107.5 kg). Body mass index is 44.07 kg/m.   Diabetes Self-Management Education - 03/02/21 1530       Visit Information   Visit Type First/Initial      Initial Visit   Diabetes Type Type 2    Are you currently following a meal plan? No    Are you taking your medications as prescribed? Yes    Date Diagnosed "since about 75 years old"      Health Coping   How would you rate your overall health? Good      Psychosocial Assessment   Patient Belief/Attitude about Diabetes Other (comment)   "don't think about it emotionally"   Self-care barriers None    Self-management support Doctor's office;Family    Patient Concerns Nutrition/Meal planning;Medication;Monitoring;Healthy Lifestyle;Problem Solving;Glycemic Control;Weight Control    Special Needs None    Preferred Learning Style Auditory;Visual;Hands on    Learning Readiness Ready    How often do you need to have someone help you when you read instructions, pamphlets, or other written materials from your doctor or pharmacy? 1 - Never    What is the last grade level you completed in school? 12 + 2      Pre-Education Assessment   Patient understands the diabetes disease and treatment process. Needs Review    Patient understands incorporating nutritional management into lifestyle. Needs Instruction    Patient undertands incorporating physical activity into lifestyle. Needs Review    Patient understands using medications safely. Needs Review    Patient understands monitoring blood glucose, interpreting and using results Needs Review    Patient understands prevention, detection, and treatment of acute complications.  Needs Instruction    Patient understands prevention, detection, and treatment of chronic complications. Needs Review    Patient understands how to develop strategies to address psychosocial issues. Needs Review    Patient understands how to develop strategies to promote health/change behavior. Needs Review      Complications   Last HgB A1C per patient/outside source 7.3 %   01/16/2021   How often do you check your blood sugar? 1-2 times/day    Fasting Blood glucose range (mg/dL) 70-129;130-179   "varies" reports FBG today in 120's mg/dL.   Number of hypoglycemic episodes per month --   Pt reports having "low sugar" in the night but doesn't check her blood sugars. She reports that she eats peanut butter cracker and goes back to bed. She denies low blood sugars in last 2 weeks.   Have you had a dilated eye exam in the past 12 months? Yes    Have you had a dental exam in the past 12 months? Yes    Are you checking your feet? Yes    How many days per week are you checking your feet? 7      Dietary Intake   Breakfast 10-12 noon - tomato sandwich with ham; fruit - apple, orange; grits; occasional cereal and milk    Lunch 3-4 pm - nuts, popcorn    Snack (afternoon) 2 pm - fruit, nuts, peanut butter, cheese and crackers    Dinner 6-8 pm - reports that she doesn't eat meat; potatoes, beans,  peas, corn, rice, pasta, green beans, greens, squash, lettuce, tomato, grapes, cuccumbers    Snack (evening) 9 pm    Beverage(s) water - only 2-3 servings per day, sometimes crystal light      Exercise   Exercise Type Light (walking / raking leaves)    How many days per week to you exercise? 5    How many minutes per day do you exercise? 10    Total minutes per week of exercise 50      Patient Education   Previous Diabetes Education Yes (please comment)    Disease state  Explored patient's options for treatment of their diabetes    Nutrition management  Role of diet in the treatment of diabetes and the  relationship between the three main macronutrients and blood glucose level;Food label reading, portion sizes and measuring food.;Reviewed blood glucose goals for pre and post meals and how to evaluate the patients' food intake on their blood glucose level.    Physical activity and exercise  Role of exercise on diabetes management, blood pressure control and cardiac health.    Medications Reviewed patients medication for diabetes, action, purpose, timing of dose and side effects.    Monitoring Taught/discussed recording of test results and interpretation of SMBG.;Identified appropriate SMBG and/or A1C goals.    Acute complications Taught treatment of hypoglycemia - the 15 rule.    Chronic complications Relationship between chronic complications and blood glucose control;Nephropathy, what it is, prevention of, the use of ACE, ARB's and early detection of through urine microalbumia.    Psychosocial adjustment Role of stress on diabetes;Identified and addressed patients feelings and concerns about diabetes      Individualized Goals (developed by patient)   Reducing Risk Other (comment)   improve blood sugars, decrease medications, prevent diabetes complications, lose weight, lead a healthier lifestyle, become more fit     Outcomes   Expected Outcomes Demonstrated interest in learning. Expect positive outcomes    Future DMSE 4-6 wks        Individualized Plan for Diabetes Self-Management Training:   Learning Objective:  Patient will have a greater understanding of diabetes self-management. Patient education plan is to attend individual and/or group sessions per assessed needs and concerns.   Plan:   Patient Instructions  Check blood sugars before breakfast or 2 hrs after supper every day Bring blood sugar records to the next appointment  Exercise:  Walk  for    5-10  minutes   5  days a week  Eat 3 meals day,   1  snack a day Space meals 4-6 hours apart Eat at least 1 protein and 1  carbohydrate serving instead of skipping a meal Allow 2-3 hours between meals/snacks Increase water intake to 4-5 servings/day  Complete 3 Day Food Record and bring to next appt  Carry fast acting glucose and a snack at all times  Return for appointment on:   Wednesday April 19, 2021 at 9:30 am with [facility] (dietitian)  Expected Outcomes:  Demonstrated interest in learning. Expect positive outcomes  Education material provided:  General Meal Planning Guidelines Simple Meal Plan Symptoms, causes and treatments of Hypoglycemia Exercise Guide (ADA)   If problems or questions, patient to contact team via:   Johny Drilling, RN, [place] [phone]  Future DSME appointment: 4-6 wks April 19, 2021 with the dietitian

## 2021-03-10 ENCOUNTER — Other Ambulatory Visit: Payer: Medicare HMO

## 2021-03-16 DIAGNOSIS — N1832 Chronic kidney disease, stage 3b: Secondary | ICD-10-CM | POA: Diagnosis not present

## 2021-03-16 DIAGNOSIS — H903 Sensorineural hearing loss, bilateral: Secondary | ICD-10-CM | POA: Diagnosis not present

## 2021-03-16 DIAGNOSIS — D447 Neoplasm of uncertain behavior of aortic body and other paraganglia: Secondary | ICD-10-CM | POA: Diagnosis not present

## 2021-03-17 DIAGNOSIS — N289 Disorder of kidney and ureter, unspecified: Secondary | ICD-10-CM | POA: Diagnosis not present

## 2021-03-17 DIAGNOSIS — E875 Hyperkalemia: Secondary | ICD-10-CM | POA: Diagnosis not present

## 2021-03-22 DIAGNOSIS — Z8371 Family history of colonic polyps: Secondary | ICD-10-CM | POA: Diagnosis not present

## 2021-03-22 DIAGNOSIS — I10 Essential (primary) hypertension: Secondary | ICD-10-CM | POA: Diagnosis not present

## 2021-03-22 DIAGNOSIS — K5900 Constipation, unspecified: Secondary | ICD-10-CM | POA: Diagnosis not present

## 2021-03-22 DIAGNOSIS — D649 Anemia, unspecified: Secondary | ICD-10-CM | POA: Diagnosis not present

## 2021-03-22 DIAGNOSIS — I208 Other forms of angina pectoris: Secondary | ICD-10-CM | POA: Diagnosis not present

## 2021-03-22 DIAGNOSIS — N1831 Chronic kidney disease, stage 3a: Secondary | ICD-10-CM | POA: Diagnosis not present

## 2021-03-22 DIAGNOSIS — E1122 Type 2 diabetes mellitus with diabetic chronic kidney disease: Secondary | ICD-10-CM | POA: Diagnosis not present

## 2021-03-22 DIAGNOSIS — E782 Mixed hyperlipidemia: Secondary | ICD-10-CM | POA: Diagnosis not present

## 2021-04-07 DIAGNOSIS — H401123 Primary open-angle glaucoma, left eye, severe stage: Secondary | ICD-10-CM | POA: Diagnosis not present

## 2021-04-07 DIAGNOSIS — H401112 Primary open-angle glaucoma, right eye, moderate stage: Secondary | ICD-10-CM | POA: Diagnosis not present

## 2021-04-19 ENCOUNTER — Ambulatory Visit: Payer: Medicare HMO | Admitting: Dietician

## 2021-04-26 DIAGNOSIS — E1140 Type 2 diabetes mellitus with diabetic neuropathy, unspecified: Secondary | ICD-10-CM | POA: Diagnosis not present

## 2021-04-26 DIAGNOSIS — Z6841 Body Mass Index (BMI) 40.0 and over, adult: Secondary | ICD-10-CM | POA: Diagnosis not present

## 2021-04-26 DIAGNOSIS — R8290 Unspecified abnormal findings in urine: Secondary | ICD-10-CM | POA: Diagnosis not present

## 2021-05-08 DIAGNOSIS — H902 Conductive hearing loss, unspecified: Secondary | ICD-10-CM | POA: Diagnosis not present

## 2021-05-08 DIAGNOSIS — H903 Sensorineural hearing loss, bilateral: Secondary | ICD-10-CM | POA: Diagnosis not present

## 2021-05-08 DIAGNOSIS — D447 Neoplasm of uncertain behavior of aortic body and other paraganglia: Secondary | ICD-10-CM | POA: Diagnosis not present

## 2021-05-08 DIAGNOSIS — H9311 Tinnitus, right ear: Secondary | ICD-10-CM | POA: Diagnosis not present

## 2021-05-12 ENCOUNTER — Encounter: Payer: Self-pay | Admitting: Dietician

## 2021-05-12 NOTE — Progress Notes (Signed)
Have not heard back from patient to reschedule her cancelled appointment from 04/19/21. Sent notification to referring provider.

## 2021-05-14 DIAGNOSIS — D1802 Hemangioma of intracranial structures: Secondary | ICD-10-CM | POA: Diagnosis not present

## 2021-05-14 DIAGNOSIS — R220 Localized swelling, mass and lump, head: Secondary | ICD-10-CM | POA: Diagnosis not present

## 2021-05-14 DIAGNOSIS — D447 Neoplasm of uncertain behavior of aortic body and other paraganglia: Secondary | ICD-10-CM | POA: Diagnosis not present

## 2021-05-19 DIAGNOSIS — D447 Neoplasm of uncertain behavior of aortic body and other paraganglia: Secondary | ICD-10-CM | POA: Diagnosis not present

## 2021-06-08 ENCOUNTER — Encounter: Payer: Self-pay | Admitting: Dietician

## 2021-06-18 DIAGNOSIS — Z03818 Encounter for observation for suspected exposure to other biological agents ruled out: Secondary | ICD-10-CM | POA: Diagnosis not present

## 2021-06-18 DIAGNOSIS — Z20822 Contact with and (suspected) exposure to covid-19: Secondary | ICD-10-CM | POA: Diagnosis not present

## 2021-06-21 DIAGNOSIS — D1809 Hemangioma of other sites: Secondary | ICD-10-CM | POA: Diagnosis not present

## 2021-06-21 DIAGNOSIS — I10 Essential (primary) hypertension: Secondary | ICD-10-CM | POA: Diagnosis not present

## 2021-06-21 DIAGNOSIS — H93A9 Pulsatile tinnitus, unspecified ear: Secondary | ICD-10-CM | POA: Diagnosis not present

## 2021-06-21 DIAGNOSIS — D447 Neoplasm of uncertain behavior of aortic body and other paraganglia: Secondary | ICD-10-CM | POA: Diagnosis not present

## 2021-06-21 DIAGNOSIS — H905 Unspecified sensorineural hearing loss: Secondary | ICD-10-CM | POA: Diagnosis not present

## 2021-06-21 DIAGNOSIS — E1142 Type 2 diabetes mellitus with diabetic polyneuropathy: Secondary | ICD-10-CM | POA: Diagnosis not present

## 2021-07-04 DIAGNOSIS — H401123 Primary open-angle glaucoma, left eye, severe stage: Secondary | ICD-10-CM | POA: Diagnosis not present

## 2021-07-04 DIAGNOSIS — H401112 Primary open-angle glaucoma, right eye, moderate stage: Secondary | ICD-10-CM | POA: Diagnosis not present

## 2021-07-17 DIAGNOSIS — E1140 Type 2 diabetes mellitus with diabetic neuropathy, unspecified: Secondary | ICD-10-CM | POA: Diagnosis not present

## 2021-07-17 DIAGNOSIS — I10 Essential (primary) hypertension: Secondary | ICD-10-CM | POA: Diagnosis not present

## 2021-07-17 DIAGNOSIS — E785 Hyperlipidemia, unspecified: Secondary | ICD-10-CM | POA: Diagnosis not present

## 2021-07-24 DIAGNOSIS — E1140 Type 2 diabetes mellitus with diabetic neuropathy, unspecified: Secondary | ICD-10-CM | POA: Diagnosis not present

## 2021-07-24 DIAGNOSIS — M25362 Other instability, left knee: Secondary | ICD-10-CM | POA: Diagnosis not present

## 2021-07-24 DIAGNOSIS — I129 Hypertensive chronic kidney disease with stage 1 through stage 4 chronic kidney disease, or unspecified chronic kidney disease: Secondary | ICD-10-CM | POA: Diagnosis not present

## 2021-07-24 DIAGNOSIS — Z0000 Encounter for general adult medical examination without abnormal findings: Secondary | ICD-10-CM | POA: Diagnosis not present

## 2021-07-24 DIAGNOSIS — M25562 Pain in left knee: Secondary | ICD-10-CM | POA: Diagnosis not present

## 2021-07-24 DIAGNOSIS — N1832 Chronic kidney disease, stage 3b: Secondary | ICD-10-CM | POA: Diagnosis not present

## 2021-07-24 DIAGNOSIS — D631 Anemia in chronic kidney disease: Secondary | ICD-10-CM | POA: Diagnosis not present

## 2021-07-24 DIAGNOSIS — R600 Localized edema: Secondary | ICD-10-CM | POA: Diagnosis not present

## 2021-07-24 DIAGNOSIS — E785 Hyperlipidemia, unspecified: Secondary | ICD-10-CM | POA: Diagnosis not present

## 2021-08-01 ENCOUNTER — Other Ambulatory Visit: Payer: Self-pay | Admitting: General Surgery

## 2021-08-01 DIAGNOSIS — Z1231 Encounter for screening mammogram for malignant neoplasm of breast: Secondary | ICD-10-CM

## 2021-08-03 DIAGNOSIS — G63 Polyneuropathy in diseases classified elsewhere: Secondary | ICD-10-CM | POA: Diagnosis not present

## 2021-08-03 DIAGNOSIS — M25562 Pain in left knee: Secondary | ICD-10-CM | POA: Diagnosis not present

## 2021-08-03 DIAGNOSIS — M1712 Unilateral primary osteoarthritis, left knee: Secondary | ICD-10-CM | POA: Diagnosis not present

## 2021-09-06 DIAGNOSIS — G63 Polyneuropathy in diseases classified elsewhere: Secondary | ICD-10-CM | POA: Diagnosis not present

## 2021-09-06 DIAGNOSIS — I10 Essential (primary) hypertension: Secondary | ICD-10-CM | POA: Diagnosis not present

## 2021-09-06 DIAGNOSIS — E119 Type 2 diabetes mellitus without complications: Secondary | ICD-10-CM | POA: Diagnosis not present

## 2021-09-06 DIAGNOSIS — E782 Mixed hyperlipidemia: Secondary | ICD-10-CM | POA: Diagnosis not present

## 2021-09-06 DIAGNOSIS — R600 Localized edema: Secondary | ICD-10-CM | POA: Diagnosis not present

## 2021-09-06 DIAGNOSIS — I208 Other forms of angina pectoris: Secondary | ICD-10-CM | POA: Diagnosis not present

## 2021-09-06 DIAGNOSIS — R0609 Other forms of dyspnea: Secondary | ICD-10-CM | POA: Diagnosis not present

## 2021-09-06 DIAGNOSIS — E1122 Type 2 diabetes mellitus with diabetic chronic kidney disease: Secondary | ICD-10-CM | POA: Diagnosis not present

## 2021-09-20 DIAGNOSIS — Z8371 Family history of colonic polyps: Secondary | ICD-10-CM | POA: Diagnosis not present

## 2021-09-20 DIAGNOSIS — K5909 Other constipation: Secondary | ICD-10-CM | POA: Diagnosis not present

## 2021-09-20 DIAGNOSIS — D649 Anemia, unspecified: Secondary | ICD-10-CM | POA: Diagnosis not present

## 2021-09-22 DIAGNOSIS — H903 Sensorineural hearing loss, bilateral: Secondary | ICD-10-CM | POA: Diagnosis not present

## 2021-09-22 DIAGNOSIS — D1809 Hemangioma of other sites: Secondary | ICD-10-CM | POA: Diagnosis not present

## 2021-09-27 ENCOUNTER — Ambulatory Visit
Admission: RE | Admit: 2021-09-27 | Discharge: 2021-09-27 | Disposition: A | Discharge status: Home / Self Care | Payer: Medicare HMO | Source: Ambulatory Visit | Attending: General Surgery | Admitting: General Surgery

## 2021-09-27 DIAGNOSIS — Z1231 Encounter for screening mammogram for malignant neoplasm of breast: Secondary | ICD-10-CM | POA: Diagnosis not present

## 2021-10-05 DIAGNOSIS — Z853 Personal history of malignant neoplasm of breast: Secondary | ICD-10-CM | POA: Diagnosis not present

## 2021-10-05 DIAGNOSIS — M1712 Unilateral primary osteoarthritis, left knee: Secondary | ICD-10-CM | POA: Diagnosis not present

## 2021-10-18 DIAGNOSIS — E1140 Type 2 diabetes mellitus with diabetic neuropathy, unspecified: Secondary | ICD-10-CM | POA: Diagnosis not present

## 2021-10-24 DIAGNOSIS — Z6841 Body Mass Index (BMI) 40.0 and over, adult: Secondary | ICD-10-CM | POA: Diagnosis not present

## 2021-10-24 DIAGNOSIS — I129 Hypertensive chronic kidney disease with stage 1 through stage 4 chronic kidney disease, or unspecified chronic kidney disease: Secondary | ICD-10-CM | POA: Diagnosis not present

## 2021-10-24 DIAGNOSIS — E785 Hyperlipidemia, unspecified: Secondary | ICD-10-CM | POA: Diagnosis not present

## 2021-10-24 DIAGNOSIS — E1140 Type 2 diabetes mellitus with diabetic neuropathy, unspecified: Secondary | ICD-10-CM | POA: Diagnosis not present

## 2021-10-24 DIAGNOSIS — N1832 Chronic kidney disease, stage 3b: Secondary | ICD-10-CM | POA: Diagnosis not present

## 2021-10-24 DIAGNOSIS — E1122 Type 2 diabetes mellitus with diabetic chronic kidney disease: Secondary | ICD-10-CM | POA: Diagnosis not present

## 2021-12-19 ENCOUNTER — Ambulatory Visit: Payer: Medicare HMO | Admitting: Anesthesiology

## 2021-12-19 ENCOUNTER — Encounter: Payer: Self-pay | Admitting: *Deleted

## 2021-12-19 ENCOUNTER — Encounter
Admission: RE | Disposition: A | Discharge status: Home / Self Care | Payer: Self-pay | Source: Home / Self Care | Attending: Gastroenterology

## 2021-12-19 ENCOUNTER — Ambulatory Visit
Admission: RE | Admit: 2021-12-19 | Discharge: 2021-12-19 | Disposition: A | Discharge status: Home / Self Care | Payer: Medicare HMO | Attending: Gastroenterology | Admitting: Gastroenterology

## 2021-12-19 DIAGNOSIS — E1140 Type 2 diabetes mellitus with diabetic neuropathy, unspecified: Secondary | ICD-10-CM | POA: Diagnosis not present

## 2021-12-19 DIAGNOSIS — E119 Type 2 diabetes mellitus without complications: Secondary | ICD-10-CM | POA: Diagnosis not present

## 2021-12-19 DIAGNOSIS — Z7984 Long term (current) use of oral hypoglycemic drugs: Secondary | ICD-10-CM | POA: Insufficient documentation

## 2021-12-19 DIAGNOSIS — I10 Essential (primary) hypertension: Secondary | ICD-10-CM | POA: Diagnosis not present

## 2021-12-19 DIAGNOSIS — Z8711 Personal history of peptic ulcer disease: Secondary | ICD-10-CM | POA: Insufficient documentation

## 2021-12-19 DIAGNOSIS — K640 First degree hemorrhoids: Secondary | ICD-10-CM | POA: Diagnosis not present

## 2021-12-19 DIAGNOSIS — D509 Iron deficiency anemia, unspecified: Secondary | ICD-10-CM | POA: Insufficient documentation

## 2021-12-19 DIAGNOSIS — N329 Bladder disorder, unspecified: Secondary | ICD-10-CM | POA: Diagnosis not present

## 2021-12-19 DIAGNOSIS — K3189 Other diseases of stomach and duodenum: Secondary | ICD-10-CM | POA: Diagnosis not present

## 2021-12-19 DIAGNOSIS — B9681 Helicobacter pylori [H. pylori] as the cause of diseases classified elsewhere: Secondary | ICD-10-CM | POA: Diagnosis not present

## 2021-12-19 DIAGNOSIS — K2960 Other gastritis without bleeding: Secondary | ICD-10-CM | POA: Diagnosis not present

## 2021-12-19 DIAGNOSIS — M1990 Unspecified osteoarthritis, unspecified site: Secondary | ICD-10-CM | POA: Insufficient documentation

## 2021-12-19 DIAGNOSIS — K2950 Unspecified chronic gastritis without bleeding: Secondary | ICD-10-CM | POA: Insufficient documentation

## 2021-12-19 DIAGNOSIS — K259 Gastric ulcer, unspecified as acute or chronic, without hemorrhage or perforation: Secondary | ICD-10-CM | POA: Insufficient documentation

## 2021-12-19 DIAGNOSIS — K649 Unspecified hemorrhoids: Secondary | ICD-10-CM | POA: Diagnosis not present

## 2021-12-19 HISTORY — PX: COLONOSCOPY WITH PROPOFOL: SHX5780

## 2021-12-19 HISTORY — PX: ESOPHAGOGASTRODUODENOSCOPY (EGD) WITH PROPOFOL: SHX5813

## 2021-12-19 LAB — GLUCOSE, CAPILLARY: Glucose-Capillary: 142 mg/dL — ABNORMAL HIGH (ref 70–99)

## 2021-12-19 SURGERY — COLONOSCOPY WITH PROPOFOL
Anesthesia: General

## 2021-12-19 MED ORDER — PROPOFOL 10 MG/ML IV BOLUS
INTRAVENOUS | Status: DC | PRN
  Administered 2021-12-19: 50 mg via INTRAVENOUS

## 2021-12-19 MED ORDER — SODIUM CHLORIDE 0.9 % IV SOLN: INTRAVENOUS | Status: DC

## 2021-12-19 MED ORDER — MIDAZOLAM HCL 2 MG/2ML IJ SOLN
INTRAMUSCULAR | Status: DC | PRN
  Administered 2021-12-19 (×2): 1 mg via INTRAVENOUS

## 2021-12-19 MED ORDER — LIDOCAINE HCL (CARDIAC) PF 100 MG/5ML IV SOSY
PREFILLED_SYRINGE | INTRAVENOUS | Status: DC | PRN
  Administered 2021-12-19: 100 mg via INTRAVENOUS

## 2021-12-19 MED ORDER — PROPOFOL 500 MG/50ML IV EMUL
INTRAVENOUS | Status: DC | PRN
  Administered 2021-12-19: 75 ug/kg/min via INTRAVENOUS

## 2021-12-19 MED ORDER — MIDAZOLAM HCL 2 MG/2ML IJ SOLN
INTRAMUSCULAR | Status: AC
  Filled 2021-12-19: qty 2

## 2021-12-19 MED ORDER — LIDOCAINE HCL (PF) 2 % IJ SOLN
INTRAMUSCULAR | Status: AC
  Filled 2021-12-19: qty 5

## 2021-12-19 NOTE — Transfer of Care (Signed)
Immediate Anesthesia Transfer of Care Note  Patient: STACY SAILER  Procedure(s) Performed: COLONOSCOPY WITH PROPOFOL ESOPHAGOGASTRODUODENOSCOPY (EGD) WITH PROPOFOL  Patient Location: PACU  Anesthesia Type:General  Level of Consciousness: sedated  Airway & Oxygen Therapy: Patient Spontanous Breathing and Patient connected to nasal cannula oxygen  Post-op Assessment: Report given to RN and Post -op Vital signs reviewed and stable  Post vital signs: Reviewed and stable  Last Vitals:  Vitals Value Taken Time  BP 119/66 12/19/21 0908  Temp    Pulse 86 12/19/21 0909  Resp 18 12/19/21 0909  SpO2 100 % 12/19/21 0909  Vitals shown include unvalidated device data.  Last Pain:  Vitals:   12/19/21 0758  TempSrc: Temporal  PainSc: 0-No pain         Complications: No notable events documented.

## 2021-12-19 NOTE — Interval H&P Note (Signed)
History and Physical Interval Note:  12/19/2021 8:37 AM  Selena Contreras  has presented today for surgery, with the diagnosis of Anemia FH Colon Polyps.  The various methods of treatment have been discussed with the patient and family. After consideration of risks, benefits and other options for treatment, the patient has consented to  Procedure(s): COLONOSCOPY WITH PROPOFOL (N/A) ESOPHAGOGASTRODUODENOSCOPY (EGD) WITH PROPOFOL (N/A) as a surgical intervention.  The patient's history has been reviewed, patient examined, no change in status, stable for surgery.  I have reviewed the patient's chart and labs.  Questions were answered to the patient's satisfaction.     Lesly Rubenstein  Ok to proceed with EGD/Colonoscopy

## 2021-12-19 NOTE — Anesthesia Preprocedure Evaluation (Addendum)
Anesthesia Evaluation  Patient identified by MRN, date of birth, ID band Patient awake    Reviewed: Allergy & Precautions, NPO status , Patient's Chart, lab work & pertinent test results  History of Anesthesia Complications (+) AWARENESS UNDER ANESTHESIA and history of anesthetic complications  Airway Mallampati: II  TM Distance: >3 FB Neck ROM: Full    Dental  (+) Missing, Poor Dentition   Pulmonary neg pulmonary ROS,    Pulmonary exam normal breath sounds clear to auscultation       Cardiovascular Exercise Tolerance: Poor Normal cardiovascular exam Rhythm:Regular Rate:Normal  Exercise tolerance limited 2/2 pain     Neuro/Psych  Neuromuscular disease negative psych ROS   GI/Hepatic Neg liver ROS, PUD,   Endo/Other  negative endocrine ROSdiabetes, Type 2  Renal/GU negative Renal ROS Bladder dysfunction      Musculoskeletal  (+) Arthritis , Osteoarthritis,    Abdominal   Peds negative pediatric ROS (+)  Hematology negative hematology ROS (+)   Anesthesia Other Findings   Reproductive/Obstetrics negative OB ROS                            Anesthesia Physical Anesthesia Plan  ASA: 3  Anesthesia Plan: General   Post-op Pain Management: Minimal or no pain anticipated   Induction: Intravenous  PONV Risk Score and Plan: 2 and Propofol infusion and TIVA  Airway Management Planned: Natural Airway and Nasal Cannula  Additional Equipment:   Intra-op Plan:   Post-operative Plan:   Informed Consent: I have reviewed the patients History and Physical, chart, labs and discussed the procedure including the risks, benefits and alternatives for the proposed anesthesia with the patient or authorized representative who has indicated his/her understanding and acceptance.     Dental Advisory Given  Plan Discussed with: Anesthesiologist, CRNA and Surgeon  Anesthesia Plan Comments: (Patient  consented for risks of anesthesia including but not limited to:  - adverse reactions to medications - risk of airway placement if required - damage to eyes, teeth, lips or other oral mucosa - nerve damage due to positioning  - sore throat or hoarseness - Damage to heart, brain, nerves, lungs, other parts of body or loss of life  Patient voiced understanding.)        Anesthesia Quick Evaluation

## 2021-12-19 NOTE — Anesthesia Postprocedure Evaluation (Signed)
Anesthesia Post Note  Patient: Selena Contreras  Procedure(s) Performed: COLONOSCOPY WITH PROPOFOL ESOPHAGOGASTRODUODENOSCOPY (EGD) WITH PROPOFOL  Patient location during evaluation: Endoscopy Anesthesia Type: General Level of consciousness: awake and alert Pain management: pain level controlled Vital Signs Assessment: post-procedure vital signs reviewed and stable Respiratory status: spontaneous breathing, nonlabored ventilation, respiratory function stable and patient connected to nasal cannula oxygen Cardiovascular status: blood pressure returned to baseline and stable Postop Assessment: no apparent nausea or vomiting Anesthetic complications: no   No notable events documented.   Last Vitals:  Vitals:   12/19/21 0928 12/19/21 0938  BP: (!) 157/76 (!) 161/67  Pulse: 78 83  Resp: 12 14  Temp:    SpO2: 96% 100%    Last Pain:  Vitals:   12/19/21 0938  TempSrc:   PainSc: 0-No pain                 Ilene Qua

## 2021-12-19 NOTE — H&P (Signed)
Outpatient short stay form Pre-procedure 12/19/2021  Lesly Rubenstein, MD  Primary Physician: Marinda Elk, MD  Reason for visit:  IDA and family history of poylps  History of present illness:    76 y/o lady with history of ida, obesity, and DM II here for EGD/Colonoscopy for IDA and family history of polyps. No family history of GI malignancies. No blood thinners. No significant abdominal surgeries. No upper GI symptoms.    Current Facility-Administered Medications:    0.9 %  sodium chloride infusion, , Intravenous, Continuous, Kherington Meraz, Hilton Cork, MD  Medications Prior to Admission  Medication Sig Dispense Refill Last Dose   Ferrous Sulfate (IRON) 325 (65 FE) MG TABS Take 1 tablet by mouth daily.   Past Week   fish oil-omega-3 fatty acids 1000 MG capsule Take 2 g by mouth daily.   Past Week   latanoprost (XALATAN) 0.005 % ophthalmic solution Place 1 drop into both eyes at bedtime.   12/18/2021   Multiple Vitamin (MULTIVITAMIN) capsule Take 1 capsule by mouth daily.   Past Week   timolol (TIMOPTIC) 0.5 % ophthalmic solution Place 1 drop into the left eye 2 (two) times daily.   12/18/2021   acetaminophen (TYLENOL) 500 MG tablet Take 500 mg by mouth every 6 (six) hours as needed.      aspirin 81 MG tablet Take 81 mg by mouth daily.   12/17/2021   Calcium Carb-Cholecalciferol (CALCIUM 500/VITAMIN D) 500-3.125 MG-MCG TABS Take 1 tablet by mouth daily.   12/17/2021   fluticasone (FLONASE) 50 MCG/ACT nasal spray Place 2 sprays into both nostrils daily. As needed   12/17/2021   glipiZIDE (GLUCOTROL) 5 MG tablet Take 5 mg by mouth 2 (two) times daily before a meal.   12/17/2021   rosuvastatin (CRESTOR) 10 MG tablet Take 5 mg by mouth daily. 1/2 of 10 mg tablet daily   12/17/2021   sitaGLIPtin-metformin (JANUMET) 50-1000 MG tablet Take 1 tablet by mouth 2 (two) times daily with a meal.   12/17/2021   solifenacin (VESICARE) 5 MG tablet Take 5 mg by mouth daily.   12/17/2021     Allergies   Allergen Reactions   Brimonidine     Burning sensation in eyes, redness   Dorzolamide     Burning sensation in eyes, redness   Flagyl [Metronidazole]     Doesn't remember reaction   Lisinopril Cough   Naprosyn [Naproxen]     UNKNOWN    Tegaderm Ag Mesh [Silver]     Skin irritation    Doxycycline Other (See Comments)    fatigue per pt   Tape Rash    Skin irritation. PAPER OK TO USE      Past Medical History:  Diagnosis Date   Anemia    Arthritis    hips, legs   Back pain    Breast cancer ([place]) 2010   LUMPECTOMY/ chemo/rad   Cancer [facility]) February 24,2010   Left breast:  T1 C., N0, M0, triple negative treated with wide excision, mastoplasty and sentinel node biopsy in February 2010   Cataracts, both eyes    Complication of anesthesia    hard to put to sleep needs more   Diabetes mellitus without complication ([place])    AGE 44   Glaucoma    Glaucoma    Hollenhorst plaque, both eyes    Hypertension    Morbid obesity ([place])    Neuropathy 2010   Personal history of chemotherapy    Personal history of radiation  therapy    S/P chemotherapy, time since greater than 12 weeks    left breast   S/P radiation therapy > 12 wks ago    left breast cancer 2010   Ulcer    STOMACH    Review of systems:  Otherwise negative.    Physical Exam  Gen: Alert, oriented. Appears stated age.  HEENT: PERRLA. Lungs: No respiratory distress CV: RRR Abd: soft, benign, no masses Ext: No edema    Planned procedures: Proceed with EGD/colonoscopy. The patient understands the nature of the planned procedure, indications, risks, alternatives and potential complications including but not limited to bleeding, infection, perforation, damage to internal organs and possible oversedation/side effects from anesthesia. The patient agrees and gives consent to proceed.  Please refer to procedure notes for findings, recommendations and patient disposition/instructions.     Lesly Rubenstein,  MD [facility] Gastroenterology

## 2021-12-19 NOTE — Op Note (Signed)
[facility] Gastroenterology Patient Name: Selena Contreras Procedure Date: 12/19/2021 8:33 AM MRN: [id number] Account #: [id number] Date of Birth: 04-17-1945 Admit Type: Outpatient Age: 76 Room: [facility] ENDO ROOM 1 Gender: Female Note Status: Finalized Instrument Name: Upper Endoscope [id number] Procedure:             Upper GI endoscopy Indications:           Iron deficiency anemia Providers:             Andrey Farmer MD, MD Referring MD:          Precious Bard, MD (Referring MD) Medicines:             Monitored Anesthesia Care Complications:         No immediate complications. Estimated blood loss:                         Minimal. Procedure:             Pre-Anesthesia Assessment:                        - Prior to the procedure, a History and Physical was                         performed, and patient medications and allergies were                         reviewed. The patient is competent. The risks and                         benefits of the procedure and the sedation options and                         risks were discussed with the patient. All questions                         were answered and informed consent was obtained.                         Patient identification and proposed procedure were                         verified by the physician, the nurse, the                         anesthesiologist, the anesthetist and the technician                         in the endoscopy suite. Mental Status Examination:                         alert and oriented. Airway Examination: normal                         oropharyngeal airway and neck mobility. Respiratory                         Examination: clear to auscultation. CV Examination:  normal. Prophylactic Antibiotics: The patient does not                         require prophylactic antibiotics. Prior                         Anticoagulants: The patient has taken no previous                          anticoagulant or antiplatelet agents except for                         aspirin. ASA Grade Assessment: II - A patient with                         mild systemic disease. After reviewing the risks and                         benefits, the patient was deemed in satisfactory                         condition to undergo the procedure. The anesthesia                         plan was to use monitored anesthesia care (MAC).                         Immediately prior to administration of medications,                         the patient was re-assessed for adequacy to receive                         sedatives. The heart rate, respiratory rate, oxygen                         saturations, blood pressure, adequacy of pulmonary                         ventilation, and response to care were monitored                         throughout the procedure. The physical status of the                         patient was re-assessed after the procedure.                        After obtaining informed consent, the endoscope was                         passed under direct vision. Throughout the procedure,                         the patient's blood pressure, pulse, and oxygen                         saturations were monitored continuously. The Endoscope  was introduced through the mouth, and advanced to the                         second part of duodenum. The upper GI endoscopy was                         accomplished without difficulty. The patient tolerated                         the procedure well. Findings:      The examined esophagus was normal.      Multiple dispersed erosions with no stigmata of recent bleeding were       found in the gastric body and in the gastric antrum. Biopsies were taken       with a cold forceps for Helicobacter pylori testing. Estimated blood       loss was minimal.      The examined duodenum was normal. Impression:            - Normal esophagus.                         - Erosive gastropathy with no stigmata of recent                         bleeding. Biopsied.                        - Normal examined duodenum. Recommendation:        - Discharge patient to home.                        - Resume previous diet.                        - Continue present medications.                        - Await pathology results.                        - Return to referring physician as previously                         scheduled. Procedure Code(s):     --- Professional ---                        [phone], Esophagogastroduodenoscopy, flexible,                         transoral; with biopsy, single or multiple Diagnosis Code(s):     --- Professional ---                        K31.89, Other diseases of stomach and duodenum                        D50.9, Iron deficiency anemia, unspecified CPT copyright 2019 [employer]. All rights reserved. The codes documented in this report are preliminary and upon coder review may  be revised to meet current compliance requirements. Andrey Farmer MD, MD 12/19/2021 9:13:27 AM Number of Addenda:  0 Note Initiated On: 12/19/2021 8:33 AM Estimated Blood Loss:  Estimated blood loss was minimal.      [facility]

## 2021-12-19 NOTE — Op Note (Signed)
[facility] Gastroenterology Patient Name: Selena Contreras Procedure Date: 12/19/2021 8:32 AM MRN: [id number] Account #: [id number] Date of Birth: Jul 26, 1945 Admit Type: Outpatient Age: 76 Room: [facility] ENDO ROOM 1 Gender: Female Note Status: Finalized Instrument Name: Jasper Riling [id number] Procedure:             Colonoscopy Indications:           Iron deficiency anemia Providers:             Andrey Farmer MD, MD Referring MD:          Precious Bard, MD (Referring MD) Medicines:             Monitored Anesthesia Care Complications:         No immediate complications. Procedure:             Pre-Anesthesia Assessment:                        - Prior to the procedure, a History and Physical was                         performed, and patient medications and allergies were                         reviewed. The patient is competent. The risks and                         benefits of the procedure and the sedation options and                         risks were discussed with the patient. All questions                         were answered and informed consent was obtained.                         Patient identification and proposed procedure were                         verified by the physician, the nurse, the                         anesthesiologist, the anesthetist and the technician                         in the endoscopy suite. Mental Status Examination:                         alert and oriented. Airway Examination: normal                         oropharyngeal airway and neck mobility. Respiratory                         Examination: clear to auscultation. CV Examination:                         normal. Prophylactic Antibiotics: The patient does not  require prophylactic antibiotics. Prior                         Anticoagulants: The patient has taken no previous                         anticoagulant or antiplatelet agents except for                          aspirin. ASA Grade Assessment: II - A patient with                         mild systemic disease. After reviewing the risks and                         benefits, the patient was deemed in satisfactory                         condition to undergo the procedure. The anesthesia                         plan was to use monitored anesthesia care (MAC).                         Immediately prior to administration of medications,                         the patient was re-assessed for adequacy to receive                         sedatives. The heart rate, respiratory rate, oxygen                         saturations, blood pressure, adequacy of pulmonary                         ventilation, and response to care were monitored                         throughout the procedure. The physical status of the                         patient was re-assessed after the procedure.                        After obtaining informed consent, the colonoscope was                         passed under direct vision. Throughout the procedure,                         the patient's blood pressure, pulse, and oxygen                         saturations were monitored continuously. The                         Colonoscope was introduced through the anus and  advanced to the the terminal ileum. The colonoscopy                         was performed without difficulty. The patient                         tolerated the procedure well. The quality of the bowel                         preparation was good. Findings:      The perianal and digital rectal examinations were normal.      The terminal ileum appeared normal.      Internal hemorrhoids were found during retroflexion. The hemorrhoids       were Grade I (internal hemorrhoids that do not prolapse).      The exam was otherwise without abnormality on direct and retroflexion       views. Impression:            - The examined portion of the  ileum was normal.                        - Internal hemorrhoids.                        - The examination was otherwise normal on direct and                         retroflexion views.                        - No specimens collected. Recommendation:        - Discharge patient to home.                        - Resume previous diet.                        - Continue present medications.                        - Repeat colonoscopy is not recommended due to current                         age (53 years or older) for screening purposes.                        - Return to referring physician as previously                         scheduled. Procedure Code(s):     --- Professional ---                        [phone], Colonoscopy, flexible; diagnostic, including                         collection of specimen(s) by brushing or washing, when                         performed (separate procedure) Diagnosis Code(s):     --- Professional ---  K64.0, First degree hemorrhoids                        D50.9, Iron deficiency anemia, unspecified CPT copyright 2019 American Medical Association. All rights reserved. The codes documented in this report are preliminary and upon coder review may  be revised to meet current compliance requirements. Andrey Farmer MD, MD 12/19/2021 9:18:22 AM Number of Addenda: 0 Note Initiated On: 12/19/2021 8:32 AM Scope Withdrawal Time: 0 hours 9 minutes 15 seconds  Total Procedure Duration: 0 hours 13 minutes 1 second  Estimated Blood Loss:  Estimated blood loss: none.      [facility]

## 2021-12-20 ENCOUNTER — Encounter: Payer: Self-pay | Admitting: Gastroenterology

## 2021-12-20 LAB — SURGICAL PATHOLOGY

## 2022-01-12 DIAGNOSIS — M1712 Unilateral primary osteoarthritis, left knee: Secondary | ICD-10-CM | POA: Diagnosis not present

## 2022-01-16 DIAGNOSIS — H25811 Combined forms of age-related cataract, right eye: Secondary | ICD-10-CM | POA: Diagnosis not present

## 2022-01-16 DIAGNOSIS — H401123 Primary open-angle glaucoma, left eye, severe stage: Secondary | ICD-10-CM | POA: Diagnosis not present

## 2022-01-16 DIAGNOSIS — H401112 Primary open-angle glaucoma, right eye, moderate stage: Secondary | ICD-10-CM | POA: Diagnosis not present

## 2022-01-17 ENCOUNTER — Ambulatory Visit (LOCAL_COMMUNITY_HEALTH_CENTER): Payer: Medicare HMO

## 2022-01-17 DIAGNOSIS — I10 Essential (primary) hypertension: Secondary | ICD-10-CM | POA: Diagnosis not present

## 2022-01-17 DIAGNOSIS — Z6841 Body Mass Index (BMI) 40.0 and over, adult: Secondary | ICD-10-CM | POA: Diagnosis not present

## 2022-01-17 DIAGNOSIS — Z23 Encounter for immunization: Secondary | ICD-10-CM | POA: Diagnosis not present

## 2022-01-17 DIAGNOSIS — E1140 Type 2 diabetes mellitus with diabetic neuropathy, unspecified: Secondary | ICD-10-CM | POA: Diagnosis not present

## 2022-01-17 DIAGNOSIS — Z719 Counseling, unspecified: Secondary | ICD-10-CM

## 2022-01-17 DIAGNOSIS — E785 Hyperlipidemia, unspecified: Secondary | ICD-10-CM | POA: Diagnosis not present

## 2022-01-17 NOTE — Progress Notes (Signed)
  Are you feeling sick today? No   Have you ever received a dose of COVID-19 Vaccine? [employer], [place], [place], [place], Other) Yes  If yes, which vaccine and how many doses?   [place], 5   Did you bring the vaccination record card or other documentation?  YES   Do you have a health condition or are undergoing treatment that makes you moderately or severely immunocompromised? This would include, but not be limited to: cancer, HIV, organ transplant, immunosuppressive therapy/high-dose corticosteroids, or moderate/severe primary immunodeficiency.  No  Have you received COVID-19 vaccine before or during hematopoietic cell transplant (HCT) or CAR-T-cell therapies? No  Have you ever had an allergic reaction to: (This would include a severe allergic reaction or a reaction that caused hives, swelling, or respiratory distress, including wheezing.) A component of a COVID-19 vaccine or a previous dose of COVID-19 vaccine? No   Have you ever had an allergic reaction to another vaccine (other thanCOVID-19 vaccine) or an injectable medication? (This would include a severe allergic reaction or a reaction that caused hives, swelling, or respiratory distress, including wheezing.)   No    Do you have a history of any of the following:  Myocarditis or Pericarditis No  Dermal fillers:  No  Multisystem Inflammatory Syndrome (MIS-C or MIS-A)? No  COVID-19 disease within the past 3 months? No  Vaccinated with monkeypox vaccine in the last 4 weeks? No  Eligible and administered SpikeVax12y+ 2023-2024, monitored, tolerated well. Verbalized understanding of VIS and NCIR. M.Gayanne Prescott, LPN

## 2022-01-24 DIAGNOSIS — I129 Hypertensive chronic kidney disease with stage 1 through stage 4 chronic kidney disease, or unspecified chronic kidney disease: Secondary | ICD-10-CM | POA: Diagnosis not present

## 2022-01-24 DIAGNOSIS — N1832 Chronic kidney disease, stage 3b: Secondary | ICD-10-CM | POA: Diagnosis not present

## 2022-01-24 DIAGNOSIS — Z853 Personal history of malignant neoplasm of breast: Secondary | ICD-10-CM | POA: Diagnosis not present

## 2022-01-24 DIAGNOSIS — E785 Hyperlipidemia, unspecified: Secondary | ICD-10-CM | POA: Diagnosis not present

## 2022-01-24 DIAGNOSIS — Z0000 Encounter for general adult medical examination without abnormal findings: Secondary | ICD-10-CM | POA: Diagnosis not present

## 2022-01-24 DIAGNOSIS — Z6841 Body Mass Index (BMI) 40.0 and over, adult: Secondary | ICD-10-CM | POA: Diagnosis not present

## 2022-01-24 DIAGNOSIS — E1122 Type 2 diabetes mellitus with diabetic chronic kidney disease: Secondary | ICD-10-CM | POA: Diagnosis not present

## 2022-01-24 DIAGNOSIS — E1142 Type 2 diabetes mellitus with diabetic polyneuropathy: Secondary | ICD-10-CM | POA: Diagnosis not present

## 2022-02-06 DIAGNOSIS — Z03818 Encounter for observation for suspected exposure to other biological agents ruled out: Secondary | ICD-10-CM | POA: Diagnosis not present

## 2022-02-06 DIAGNOSIS — J029 Acute pharyngitis, unspecified: Secondary | ICD-10-CM | POA: Diagnosis not present

## 2022-02-06 DIAGNOSIS — R0981 Nasal congestion: Secondary | ICD-10-CM | POA: Diagnosis not present

## 2022-02-14 ENCOUNTER — Ambulatory Visit: Payer: Medicare HMO | Admitting: Internal Medicine

## 2022-02-14 ENCOUNTER — Other Ambulatory Visit: Payer: Medicare HMO

## 2022-03-05 DIAGNOSIS — A048 Other specified bacterial intestinal infections: Secondary | ICD-10-CM | POA: Diagnosis not present

## 2022-03-12 ENCOUNTER — Other Ambulatory Visit: Payer: Self-pay | Admitting: *Deleted

## 2022-03-12 DIAGNOSIS — Z171 Estrogen receptor negative status [ER-]: Secondary | ICD-10-CM

## 2022-03-13 ENCOUNTER — Inpatient Hospital Stay (HOSPITAL_BASED_OUTPATIENT_CLINIC_OR_DEPARTMENT_OTHER): Payer: Medicare HMO | Admitting: Internal Medicine

## 2022-03-13 ENCOUNTER — Encounter: Payer: Self-pay | Admitting: Internal Medicine

## 2022-03-13 ENCOUNTER — Inpatient Hospital Stay: Payer: Medicare HMO | Attending: Internal Medicine

## 2022-03-13 DIAGNOSIS — C50812 Malignant neoplasm of overlapping sites of left female breast: Secondary | ICD-10-CM | POA: Diagnosis not present

## 2022-03-13 DIAGNOSIS — E1122 Type 2 diabetes mellitus with diabetic chronic kidney disease: Secondary | ICD-10-CM | POA: Insufficient documentation

## 2022-03-13 DIAGNOSIS — Z853 Personal history of malignant neoplasm of breast: Secondary | ICD-10-CM | POA: Diagnosis not present

## 2022-03-13 DIAGNOSIS — D649 Anemia, unspecified: Secondary | ICD-10-CM | POA: Diagnosis not present

## 2022-03-13 DIAGNOSIS — M8580 Other specified disorders of bone density and structure, unspecified site: Secondary | ICD-10-CM | POA: Insufficient documentation

## 2022-03-13 DIAGNOSIS — I129 Hypertensive chronic kidney disease with stage 1 through stage 4 chronic kidney disease, or unspecified chronic kidney disease: Secondary | ICD-10-CM | POA: Diagnosis not present

## 2022-03-13 DIAGNOSIS — Z803 Family history of malignant neoplasm of breast: Secondary | ICD-10-CM | POA: Diagnosis not present

## 2022-03-13 DIAGNOSIS — Z171 Estrogen receptor negative status [ER-]: Secondary | ICD-10-CM

## 2022-03-13 DIAGNOSIS — N183 Chronic kidney disease, stage 3 unspecified: Secondary | ICD-10-CM | POA: Diagnosis not present

## 2022-03-13 LAB — COMPREHENSIVE METABOLIC PANEL
ALT: 17 U/L (ref 0–44)
AST: 21 U/L (ref 15–41)
Albumin: 3.5 g/dL (ref 3.5–5.0)
Alkaline Phosphatase: 71 U/L (ref 38–126)
Anion gap: 12 (ref 5–15)
BUN: 24 mg/dL — ABNORMAL HIGH (ref 8–23)
CO2: 22 mmol/L (ref 22–32)
Calcium: 9.3 mg/dL (ref 8.9–10.3)
Chloride: 110 mmol/L (ref 98–111)
Creatinine, Ser: 1.21 mg/dL — ABNORMAL HIGH (ref 0.44–1.00)
GFR, Estimated: 47 mL/min — ABNORMAL LOW (ref >60)
Glucose, Bld: 133 mg/dL — ABNORMAL HIGH (ref 70–99)
Potassium: 4.4 mmol/L (ref 3.5–5.1)
Sodium: 144 mmol/L (ref 135–145)
Total Bilirubin: 0.5 mg/dL (ref 0.3–1.2)
Total Protein: 7.5 g/dL (ref 6.5–8.1)

## 2022-03-13 LAB — CBC WITH DIFFERENTIAL/PLATELET
Abs Immature Granulocytes: 0.02 10*3/uL (ref 0.00–0.07)
Basophils Absolute: 0.1 10*3/uL (ref 0.0–0.1)
Basophils Relative: 1 %
Eosinophils Absolute: 0.3 10*3/uL (ref 0.0–0.5)
Eosinophils Relative: 3 %
HCT: 36.1 % (ref 36.0–46.0)
Hemoglobin: 11.4 g/dL — ABNORMAL LOW (ref 12.0–15.0)
Immature Granulocytes: 0 %
Lymphocytes Relative: 32 %
Lymphs Abs: 3 10*3/uL (ref 0.7–4.0)
MCH: 27.9 pg (ref 26.0–34.0)
MCHC: 31.6 g/dL (ref 30.0–36.0)
MCV: 88.3 fL (ref 80.0–100.0)
Monocytes Absolute: 0.7 10*3/uL (ref 0.1–1.0)
Monocytes Relative: 8 %
Neutro Abs: 5.5 10*3/uL (ref 1.7–7.7)
Neutrophils Relative %: 56 %
Platelets: 290 10*3/uL (ref 150–400)
RBC: 4.09 MIL/uL (ref 3.87–5.11)
RDW: 14.6 % (ref 11.5–15.5)
WBC: 9.6 10*3/uL (ref 4.0–10.5)
nRBC: 0 % (ref 0.0–0.2)

## 2022-03-13 NOTE — Progress Notes (Signed)
Patient denies new problems/concerns today.   °

## 2022-03-13 NOTE — Progress Notes (Signed)
[place] OFFICE PROGRESS NOTE  Patient Care Team: Marinda Elk, MD as PCP - General (Physician Assistant) Bary Castilla Forest Gleason, MD as Consulting Physician (General Surgery) Cammie Sickle, MD as Consulting Physician (Internal Medicine)   Cancer Staging  Carcinoma of overlapping sites of left breast in female, estrogen receptor negative [facility]) Staging form: Breast, AJCC 7th Edition - Clinical: No stage assigned - Unsigned    Oncology History Overview Note  # FEB 2010- Left breast:  T1 C., N0, M0, triple negative treated with wide excisions/p Lumpectomy and sentinel node biopsy. [Dr.Byrnett]; s/p Adj Chemo [Dr.Yabanez]; s/p RT;   # chronic anemia [at least 2013]- ? Sep 2017;[facility]; again in 5 years.   # CKD [creat 1.18/ DM-2 poorly controlled]   DIAGNOSIS: Left breast cancer  STAGE:  I       ;GOALS: cure  CURRENT/MOST RECENT THERAPY: surveillaince    Breast cancer ([place])  05/26/2008 Initial Diagnosis   Breast cancer ([place])   Carcinoma of overlapping sites of left breast in female, estrogen receptor negative ([place])    INTERVAL HISTORY: Alone.  Ambulating independently.  Clemencia Course 76 y.o.  female pleasant patient above history of  Stage I breast cancer is here for a follow-up.  Patient denies new problems/concerns today.  Denies any nausea vomiting.  Denies any fevers or chills.  No new lumps or bumps.  No headaches.   Review of Systems  Constitutional:  Negative for chills, diaphoresis, fever, malaise/fatigue and weight loss.  HENT:  Negative for nosebleeds and sore throat.   Eyes:  Negative for double vision.  Respiratory:  Negative for cough, hemoptysis, sputum production, shortness of breath and wheezing.   Cardiovascular:  Negative for chest pain, palpitations, orthopnea and leg swelling.  Gastrointestinal:  Negative for abdominal pain, blood in stool, constipation, diarrhea, heartburn, melena, nausea and vomiting.   Genitourinary:  Negative for dysuria, frequency and urgency.  Musculoskeletal:  Positive for back pain and neck pain. Negative for joint pain.  Skin: Negative.  Negative for itching and rash.  Neurological:  Negative for dizziness, tingling, focal weakness, weakness and headaches.  Endo/Heme/Allergies:  Does not bruise/bleed easily.  Psychiatric/Behavioral:  Negative for depression. The patient is not nervous/anxious and does not have insomnia.     PAST MEDICAL HISTORY :  Past Medical History:  Diagnosis Date   Anemia    Arthritis    hips, legs   Back pain    Breast cancer ([place]) 2010   LUMPECTOMY/ chemo/rad   Cancer [facility]) February 24,2010   Left breast:  T1 C., N0, M0, triple negative treated with wide excision, mastoplasty and sentinel node biopsy in February 2010   Cataracts, both eyes    Complication of anesthesia    hard to put to sleep needs more   Diabetes mellitus without complication ([place])    AGE 25   Glaucoma    Glaucoma    Hollenhorst plaque, both eyes    Hypertension    Morbid obesity ([place])    Neuropathy 2010   Personal history of chemotherapy    Personal history of radiation therapy    S/P chemotherapy, time since greater than 12 weeks    left breast   S/P radiation therapy > 12 wks ago    left breast cancer 2010   Ulcer    STOMACH    PAST SURGICAL HISTORY :   Past Surgical History:  Procedure Laterality Date   BREAST BIOPSY Left    negative 2007  BREAST BIOPSY Right 07/2015   complex sclerosing lesion   BREAST BIOPSY Right 07/27/2015   COMPLEX SCLEROSING LESION WITH USUAL DUCTAL HYPERPLASIA AND CALCIFICATIONS   BREAST CYST ASPIRATION Right    2002   BREAST EXCISIONAL BIOPSY Left    positive 2010   BREAST LUMPECTOMY Left    left breast cancer   BREAST SURGERY Left 2010   LEFT BREAST WIDE EXCISION   COLONOSCOPY  2012, 12/16/2015   [place]   COLONOSCOPY WITH PROPOFOL N/A 12/16/2015   Procedure: COLONOSCOPY WITH PROPOFOL;  Surgeon: Manya Silvas, MD;  Location: [place];  Service: Endoscopy;  Laterality: N/A;   COLONOSCOPY WITH PROPOFOL N/A 12/19/2021   Procedure: COLONOSCOPY WITH PROPOFOL;  Surgeon: Lesly Rubenstein, MD;  Location: [facility] ENDOSCOPY;  Service: Endoscopy;  Laterality: N/A;   DILATATION & CURETTAGE/HYSTEROSCOPY WITH MYOSURE N/A 07/01/2015   Procedure: DILATATION & CURETTAGE/HYSTEROSCOPY WITH MYOSURE;  Surgeon: Boykin Nearing, MD;  Location: [facility] ORS;  Service: Gynecology;  Laterality: N/A;   DILATION AND CURETTAGE OF UTERUS     ESOPHAGOGASTRODUODENOSCOPY (EGD) WITH PROPOFOL N/A 12/19/2021   Procedure: ESOPHAGOGASTRODUODENOSCOPY (EGD) WITH PROPOFOL;  Surgeon: Lesly Rubenstein, MD;  Location: [facility] ENDOSCOPY;  Service: Endoscopy;  Laterality: N/A;   EYE SURGERY     FOR GLAUCOMA   PHOTOCOAGULATION Left 03/19/2016   Procedure: PHOTOCOAGULATION;  Surgeon: Ronnell Freshwater, MD;  Location: [facility];  Service: Ophthalmology;  Laterality: Left;  Diabetic - oral meds CANNOT arrive before 8AM IVA Block   SENTINEL LYMPH NODE BIOPSY Left 2010   TOE SURGERY      FAMILY HISTORY :   Family History  Problem Relation Age of Onset   Diabetes Mother    Diabetes Father    Arthritis Sister    Diabetes Sister    Breast cancer Sister 9   Breast cancer Sister 67   Diabetes Brother    Breast cancer Other     SOCIAL HISTORY:   Social History   Tobacco Use   Smoking status: Never   Smokeless tobacco: Never  Substance Use Topics   Alcohol use: No   Drug use: No    ALLERGIES:  is allergic to brimonidine, dorzolamide, flagyl [metronidazole], lisinopril, naprosyn [naproxen], tegaderm ag mesh [silver], doxycycline, latex, and tape.  MEDICATIONS:  Current Outpatient Medications  Medication Sig Dispense Refill   acetaminophen (TYLENOL) 500 MG tablet Take 500 mg by mouth every 6 (six) hours as needed.     aspirin 81 MG tablet Take 81 mg by mouth daily.     Calcium Carb-Cholecalciferol  (CALCIUM 500/VITAMIN D) 500-3.125 MG-MCG TABS Take 1 tablet by mouth daily.     Ferrous Sulfate (IRON) 325 (65 FE) MG TABS Take 1 tablet by mouth daily.     fexofenadine (ALLEGRA) 180 MG tablet Take 180 mg by mouth daily.     fish oil-omega-3 fatty acids 1000 MG capsule Take 2 g by mouth daily.     fluticasone (FLONASE) 50 MCG/ACT nasal spray Place 2 sprays into both nostrils daily. As needed     glipiZIDE (GLUCOTROL) 5 MG tablet Take 5 mg by mouth 2 (two) times daily before a meal.     latanoprost (XALATAN) 0.005 % ophthalmic solution Place 1 drop into both eyes at bedtime.     LINZESS 72 MCG capsule Take 72 mcg by mouth daily.     metFORMIN (GLUCOPHAGE) 1000 MG tablet Take 1,000 mg by mouth 2 (two) times daily.     Multiple Vitamin (MULTIVITAMIN)  capsule Take 1 capsule by mouth daily.     OZEMPIC, 1 MG/DOSE, 4 MG/3ML SOPN Inject 1 mg into the skin once a week.     pantoprazole (PROTONIX) 40 MG tablet Take 40 mg by mouth daily.     rosuvastatin (CRESTOR) 10 MG tablet Take 5 mg by mouth daily. 1/2 of 10 mg tablet daily     solifenacin (VESICARE) 5 MG tablet Take 5 mg by mouth daily.     timolol (TIMOPTIC) 0.5 % ophthalmic solution Place 1 drop into the left eye 2 (two) times daily.     sitaGLIPtin-metformin (JANUMET) 50-1000 MG tablet Take 1 tablet by mouth 2 (two) times daily with a meal. (Patient not taking: Reported on 03/13/2022)     No current facility-administered medications for this visit.    PHYSICAL EXAMINATION: ECOG PERFORMANCE STATUS: 0 - Asymptomatic  BP 126/76 (BP Location: Right Arm, Patient Position: Sitting)   Pulse 98   Temp 99 F (37.2 C) (Tympanic)   Resp 16   Wt 216 lb 3.2 oz (98.1 kg)   BMI 39.54 kg/m   Filed Weights   03/13/22 1400  Weight: 216 lb 3.2 oz (98.1 kg)    Physical Exam HENT:     Head: Normocephalic and atraumatic.     Mouth/Throat:     Pharynx: No oropharyngeal exudate.  Eyes:     Pupils: Pupils are equal, round, and reactive to light.   Cardiovascular:     Rate and Rhythm: Normal rate and regular rhythm.  Pulmonary:     Effort: No respiratory distress.     Breath sounds: No wheezing.  Abdominal:     General: Bowel sounds are normal. There is no distension.     Palpations: Abdomen is soft. There is no mass.     Tenderness: There is no abdominal tenderness. There is no guarding or rebound.  Musculoskeletal:        General: No tenderness. Normal range of motion.     Cervical back: Normal range of motion and neck supple.  Skin:    General: Skin is warm.     Comments: Right and left BREAST exam [in the presence of nurse]- no unusual skin changes or dominant masses felt. Surgical scars noted.    Neurological:     Mental Status: She is alert and oriented to person, place, and time.  Psychiatric:        Mood and Affect: Affect normal.     Breast exam- defred.  LABORATORY DATA:  I have reviewed the data as listed    Component Value Date/Time   NA 144 03/13/2022 1413   NA 140 10/01/2011 1349   K 4.4 03/13/2022 1413   K 4.5 10/01/2011 1349   CL 110 03/13/2022 1413   CL 104 10/01/2011 1349   CO2 22 03/13/2022 1413   CO2 27 10/01/2011 1349   GLUCOSE 133 (H) 03/13/2022 1413   GLUCOSE 138 (H) 10/01/2011 1349   BUN 24 (H) 03/13/2022 1413   BUN 16 10/01/2011 1349   CREATININE 1.21 (H) 03/13/2022 1413   CREATININE 0.89 01/06/2014 0938   CALCIUM 9.3 03/13/2022 1413   CALCIUM 10.1 10/01/2011 1349   PROT 7.5 03/13/2022 1413   PROT 7.1 01/06/2014 0938   ALBUMIN 3.5 03/13/2022 1413   ALBUMIN 3.2 (L) 01/06/2014 0938   AST 21 03/13/2022 1413   AST 18 01/06/2014 0938   ALT 17 03/13/2022 1413   ALT 24 01/06/2014 0938   ALKPHOS 71 03/13/2022 1413  ALKPHOS 84 01/06/2014 0938   BILITOT 0.5 03/13/2022 1413   BILITOT 0.3 01/06/2014 0938   GFRNONAA 47 (L) 03/13/2022 1413   GFRNONAA >60 01/06/2014 0938   GFRNONAA 60 (L) 10/01/2012 0912   GFRAA 53 (L) 09/02/2019 1424   GFRAA >60 01/06/2014 0938   GFRAA >60 10/01/2012  0912    No results found for: "SPEP", "UPEP"  Lab Results  Component Value Date   WBC 9.6 03/13/2022   NEUTROABS 5.5 03/13/2022   HGB 11.4 (L) 03/13/2022   HCT 36.1 03/13/2022   MCV 88.3 03/13/2022   PLT 290 03/13/2022      Chemistry      Component Value Date/Time   NA 144 03/13/2022 1413   NA 140 10/01/2011 1349   K 4.4 03/13/2022 1413   K 4.5 10/01/2011 1349   CL 110 03/13/2022 1413   CL 104 10/01/2011 1349   CO2 22 03/13/2022 1413   CO2 27 10/01/2011 1349   BUN 24 (H) 03/13/2022 1413   BUN 16 10/01/2011 1349   CREATININE 1.21 (H) 03/13/2022 1413   CREATININE 0.89 01/06/2014 0938      Component Value Date/Time   CALCIUM 9.3 03/13/2022 1413   CALCIUM 10.1 10/01/2011 1349   ALKPHOS 71 03/13/2022 1413   ALKPHOS 84 01/06/2014 0938   AST 21 03/13/2022 1413   AST 18 01/06/2014 0938   ALT 17 03/13/2022 1413   ALT 24 01/06/2014 0938   BILITOT 0.5 03/13/2022 1413   BILITOT 0.3 01/06/2014 0938       RADIOGRAPHIC STUDIES: I have personally reviewed the radiological images as listed and agreed with the findings in the report. No results found.   ASSESSMENT & PLAN:  Carcinoma of overlapping sites of left breast in female, estrogen receptor negative ([place]) # LEFT BREAST -  Stage I triple breast cancer status post lumpectomy; radiation [Dr.Byrnett]  STABLE; mammo June 7th 2023  WNL. Ca-27-29-pending. Would NOT recommend checking ca27-29 as patient is essentially cured of her cancer.  However recommended patient continue surveillance physical exams/mammograms on a yearly basis.  #Chronic left breast neuropathic pain- STABLE.   # chronic Anemia-unclear etiology.  On p.o. iron.  Hemoglobin between 10-11.  STABLE.  # CKD stage III GFR 45.-Recommend continued follow-up with PCP.   # BMD-April 2021- Osteopenia [[facility]]- On ca+vitD- continue exercise; defer to PCP for further follow up.   #Since patient is clinically stable I think is reasonable for the patient to follow-up with  PCP/can follow-up with [place] as needed.  Patient comfortable with the plan; to call us if any questions or concerns in the interim.  # DISPOSITION: # follow up as needed- -Dr.B.   CC: Ms. Boykin Reaper.    No orders of the defined types were placed in this encounter.  All questions were answered. The patient knows to call the clinic with any problems, questions or concerns.      Cammie Sickle, MD 03/13/2022 3:15 PM

## 2022-03-13 NOTE — Assessment & Plan Note (Addendum)
#   LEFT BREAST -  Stage I triple breast cancer status post lumpectomy; radiation [Dr.Byrnett]  STABLE; mammo June 7th 2023  WNL. Ca-27-29-pending. Would NOT recommend checking ca27-29 as patient is essentially cured of her cancer.  However recommended patient continue surveillance physical exams/mammograms on a yearly basis.  #Chronic left breast neuropathic pain- STABLE.   # chronic Anemia-unclear etiology.  On p.o. iron.  Hemoglobin between 10-11.  STABLE.  # CKD stage III GFR 45.-Recommend continued follow-up with PCP.   # BMD-April 2021- Osteopenia [[facility]]- On ca+vitD- continue exercise; defer to PCP for further follow up.   #Since patient is clinically stable I think is reasonable for the patient to follow-up with PCP/can follow-up with [place] as needed.  Patient comfortable with the plan; to call us if any questions or concerns in the interim.  # DISPOSITION: # follow up as needed- -Dr.B.   CC: Ms. Boykin Reaper.

## 2022-03-14 LAB — CANCER ANTIGEN 27.29: CA 27.29: <9 U/mL (ref 0.0–38.6)

## 2022-03-21 DIAGNOSIS — L600 Ingrowing nail: Secondary | ICD-10-CM | POA: Diagnosis not present

## 2022-03-21 DIAGNOSIS — I2089 Other forms of angina pectoris: Secondary | ICD-10-CM | POA: Diagnosis not present

## 2022-03-21 DIAGNOSIS — I10 Essential (primary) hypertension: Secondary | ICD-10-CM | POA: Diagnosis not present

## 2022-03-21 DIAGNOSIS — N1831 Chronic kidney disease, stage 3a: Secondary | ICD-10-CM | POA: Diagnosis not present

## 2022-03-21 DIAGNOSIS — E1122 Type 2 diabetes mellitus with diabetic chronic kidney disease: Secondary | ICD-10-CM | POA: Diagnosis not present

## 2022-03-21 DIAGNOSIS — E782 Mixed hyperlipidemia: Secondary | ICD-10-CM | POA: Diagnosis not present

## 2022-04-23 DIAGNOSIS — H2511 Age-related nuclear cataract, right eye: Secondary | ICD-10-CM | POA: Diagnosis not present

## 2022-04-23 DIAGNOSIS — H401112 Primary open-angle glaucoma, right eye, moderate stage: Secondary | ICD-10-CM | POA: Diagnosis not present

## 2022-05-02 DIAGNOSIS — D649 Anemia, unspecified: Secondary | ICD-10-CM | POA: Diagnosis not present

## 2022-05-02 DIAGNOSIS — Z6841 Body Mass Index (BMI) 40.0 and over, adult: Secondary | ICD-10-CM | POA: Diagnosis not present

## 2022-05-02 DIAGNOSIS — Z8719 Personal history of other diseases of the digestive system: Secondary | ICD-10-CM | POA: Diagnosis not present

## 2022-05-02 DIAGNOSIS — K5909 Other constipation: Secondary | ICD-10-CM | POA: Diagnosis not present

## 2022-05-02 DIAGNOSIS — Z8619 Personal history of other infectious and parasitic diseases: Secondary | ICD-10-CM | POA: Diagnosis not present

## 2022-05-20 DIAGNOSIS — U071 COVID-19: Secondary | ICD-10-CM | POA: Diagnosis not present

## 2022-05-20 DIAGNOSIS — Z03818 Encounter for observation for suspected exposure to other biological agents ruled out: Secondary | ICD-10-CM | POA: Diagnosis not present

## 2022-07-24 DIAGNOSIS — E1140 Type 2 diabetes mellitus with diabetic neuropathy, unspecified: Secondary | ICD-10-CM | POA: Diagnosis not present

## 2022-07-24 DIAGNOSIS — I10 Essential (primary) hypertension: Secondary | ICD-10-CM | POA: Diagnosis not present

## 2022-07-24 DIAGNOSIS — E785 Hyperlipidemia, unspecified: Secondary | ICD-10-CM | POA: Diagnosis not present

## 2022-07-31 ENCOUNTER — Other Ambulatory Visit: Payer: Self-pay | Admitting: Physician Assistant

## 2022-07-31 DIAGNOSIS — Z1231 Encounter for screening mammogram for malignant neoplasm of breast: Secondary | ICD-10-CM

## 2022-07-31 DIAGNOSIS — E1122 Type 2 diabetes mellitus with diabetic chronic kidney disease: Secondary | ICD-10-CM | POA: Diagnosis not present

## 2022-07-31 DIAGNOSIS — N1831 Chronic kidney disease, stage 3a: Secondary | ICD-10-CM | POA: Diagnosis not present

## 2022-07-31 DIAGNOSIS — Z6838 Body mass index (BMI) 38.0-38.9, adult: Secondary | ICD-10-CM | POA: Diagnosis not present

## 2022-07-31 DIAGNOSIS — E785 Hyperlipidemia, unspecified: Secondary | ICD-10-CM | POA: Diagnosis not present

## 2022-07-31 DIAGNOSIS — E1142 Type 2 diabetes mellitus with diabetic polyneuropathy: Secondary | ICD-10-CM | POA: Diagnosis not present

## 2022-07-31 DIAGNOSIS — Z853 Personal history of malignant neoplasm of breast: Secondary | ICD-10-CM | POA: Diagnosis not present

## 2022-07-31 DIAGNOSIS — Z0000 Encounter for general adult medical examination without abnormal findings: Secondary | ICD-10-CM | POA: Diagnosis not present

## 2022-10-01 ENCOUNTER — Ambulatory Visit
Admission: RE | Admit: 2022-10-01 | Discharge: 2022-10-01 | Disposition: A | Discharge status: Home / Self Care | Payer: Medicare HMO | Source: Ambulatory Visit | Attending: Physician Assistant | Admitting: Physician Assistant

## 2022-10-01 DIAGNOSIS — Z1231 Encounter for screening mammogram for malignant neoplasm of breast: Secondary | ICD-10-CM | POA: Diagnosis not present

## 2022-11-13 DIAGNOSIS — H401123 Primary open-angle glaucoma, left eye, severe stage: Secondary | ICD-10-CM | POA: Diagnosis not present

## 2022-11-13 DIAGNOSIS — H401112 Primary open-angle glaucoma, right eye, moderate stage: Secondary | ICD-10-CM | POA: Diagnosis not present

## 2022-12-07 DIAGNOSIS — M21162 Varus deformity, not elsewhere classified, left knee: Secondary | ICD-10-CM | POA: Diagnosis not present

## 2022-12-07 DIAGNOSIS — E1140 Type 2 diabetes mellitus with diabetic neuropathy, unspecified: Secondary | ICD-10-CM | POA: Diagnosis not present

## 2022-12-07 DIAGNOSIS — M25562 Pain in left knee: Secondary | ICD-10-CM | POA: Diagnosis not present

## 2022-12-07 DIAGNOSIS — C50911 Malignant neoplasm of unspecified site of right female breast: Secondary | ICD-10-CM | POA: Diagnosis not present

## 2022-12-07 DIAGNOSIS — M21161 Varus deformity, not elsewhere classified, right knee: Secondary | ICD-10-CM | POA: Diagnosis not present

## 2022-12-07 DIAGNOSIS — Z6841 Body Mass Index (BMI) 40.0 and over, adult: Secondary | ICD-10-CM | POA: Diagnosis not present

## 2022-12-07 DIAGNOSIS — M170 Bilateral primary osteoarthritis of knee: Secondary | ICD-10-CM | POA: Diagnosis not present

## 2022-12-07 DIAGNOSIS — M1711 Unilateral primary osteoarthritis, right knee: Secondary | ICD-10-CM | POA: Diagnosis not present

## 2022-12-18 ENCOUNTER — Inpatient Hospital Stay: Payer: Medicare HMO

## 2022-12-18 ENCOUNTER — Encounter: Payer: Self-pay | Admitting: Internal Medicine

## 2022-12-18 ENCOUNTER — Inpatient Hospital Stay: Payer: Medicare HMO | Attending: Internal Medicine | Admitting: Internal Medicine

## 2022-12-18 VITALS — BP 132/80 | HR 84 | Temp 98.9°F | Ht 62.0 in | Wt 215.0 lb

## 2022-12-18 DIAGNOSIS — Z923 Personal history of irradiation: Secondary | ICD-10-CM | POA: Insufficient documentation

## 2022-12-18 DIAGNOSIS — Z853 Personal history of malignant neoplasm of breast: Secondary | ICD-10-CM | POA: Insufficient documentation

## 2022-12-18 DIAGNOSIS — Z9221 Personal history of antineoplastic chemotherapy: Secondary | ICD-10-CM | POA: Insufficient documentation

## 2022-12-18 DIAGNOSIS — M8580 Other specified disorders of bone density and structure, unspecified site: Secondary | ICD-10-CM | POA: Insufficient documentation

## 2022-12-18 DIAGNOSIS — C50812 Malignant neoplasm of overlapping sites of left female breast: Secondary | ICD-10-CM

## 2022-12-18 DIAGNOSIS — N644 Mastodynia: Secondary | ICD-10-CM | POA: Diagnosis not present

## 2022-12-18 DIAGNOSIS — D649 Anemia, unspecified: Secondary | ICD-10-CM | POA: Diagnosis not present

## 2022-12-18 DIAGNOSIS — E1122 Type 2 diabetes mellitus with diabetic chronic kidney disease: Secondary | ICD-10-CM | POA: Insufficient documentation

## 2022-12-18 DIAGNOSIS — Z803 Family history of malignant neoplasm of breast: Secondary | ICD-10-CM | POA: Diagnosis not present

## 2022-12-18 DIAGNOSIS — N183 Chronic kidney disease, stage 3 unspecified: Secondary | ICD-10-CM | POA: Diagnosis not present

## 2022-12-18 DIAGNOSIS — Z171 Estrogen receptor negative status [ER-]: Secondary | ICD-10-CM

## 2022-12-18 NOTE — Progress Notes (Signed)
[facility] OFFICE PROGRESS NOTE  Patient Care Team: Patrice Paradise, MD as PCP - General (Physician Assistant) Lemar Livings Merrily Pew, MD as Consulting Physician (General Surgery) Earna Coder, MD as Consulting Physician (Internal Medicine)   Cancer Staging  Carcinoma of overlapping sites of left breast in female, estrogen receptor negative [facility]) Staging form: Breast, AJCC 7th Edition - Clinical: No stage assigned - Unsigned    Oncology History Overview Note  # FEB 2010- Left breast:  T1 C., N0, M0, triple negative treated with wide excisions/p Lumpectomy and sentinel node biopsy. [Dr.Byrnett]; s/p Adj Chemo [Dr.Yabanez]; s/p RT;   # chronic anemia [at least 2013]- ? Sep 2017;[facility]; again in 5 years.   # CKD [creat 1.18/ DM-2 poorly controlled]   DIAGNOSIS: Left breast cancer  STAGE:  I       ;GOALS: cure  CURRENT/MOST RECENT THERAPY: surveillaince    Breast cancer (HCC)  05/26/2008 Initial Diagnosis   Breast cancer (HCC)   Carcinoma of overlapping sites of left breast in female, estrogen receptor negative (HCC)    INTERVAL HISTORY: Alone.  Ambulating independently.  Selena Contreras 77 y.o.  female pleasant patient above history of  Stage I breast cancer is here for a follow-up.    Patient denies new problems/concerns today.  Denies any nausea vomiting.  Denies any fevers or chills.  No new lumps or bumps.  No headaches.   Review of Systems  Constitutional:  Negative for chills, diaphoresis, fever, malaise/fatigue and weight loss.  HENT:  Negative for nosebleeds and sore throat.   Eyes:  Negative for double vision.  Respiratory:  Negative for cough, hemoptysis, sputum production, shortness of breath and wheezing.   Cardiovascular:  Negative for chest pain, palpitations, orthopnea and leg swelling.  Gastrointestinal:  Negative for abdominal pain, blood in stool, constipation, diarrhea, heartburn, melena, nausea and vomiting.   Genitourinary:  Negative for dysuria, frequency and urgency.  Musculoskeletal:  Positive for back pain and neck pain. Negative for joint pain.  Skin: Negative.  Negative for itching and rash.  Neurological:  Negative for dizziness, tingling, focal weakness, weakness and headaches.  Endo/Heme/Allergies:  Does not bruise/bleed easily.  Psychiatric/Behavioral:  Negative for depression. The patient is not nervous/anxious and does not have insomnia.     PAST MEDICAL HISTORY :  Past Medical History:  Diagnosis Date   Anemia    Arthritis    hips, legs   Back pain    Breast cancer (HCC) 2010   LUMPECTOMY/ chemo/rad   Cancer [facility]) February 24,2010   Left breast:  T1 C., N0, M0, triple negative treated with wide excision, mastoplasty and sentinel node biopsy in February 2010   Cataracts, both eyes    Complication of anesthesia    hard to put to sleep needs more   Diabetes mellitus without complication (HCC)    AGE 49   Glaucoma    Glaucoma    Hollenhorst plaque, both eyes    Hypertension    Morbid obesity (HCC)    Neuropathy 2010   Personal history of chemotherapy    Personal history of radiation therapy    S/P chemotherapy, time since greater than 12 weeks    left breast   S/P radiation therapy > 12 wks ago    left breast cancer 2010   Ulcer    STOMACH    PAST SURGICAL HISTORY :   Past Surgical History:  Procedure Laterality Date   BREAST BIOPSY Left    negative  2007   BREAST BIOPSY Right 07/2015   complex sclerosing lesion   BREAST BIOPSY Right 07/27/2015   COMPLEX SCLEROSING LESION WITH USUAL DUCTAL HYPERPLASIA AND CALCIFICATIONS   BREAST CYST ASPIRATION Right    2002   BREAST EXCISIONAL BIOPSY Left    positive 2010   BREAST LUMPECTOMY Left    left breast cancer   BREAST SURGERY Left 2010   LEFT BREAST WIDE EXCISION   COLONOSCOPY  2012, 12/16/2015   [place]   COLONOSCOPY WITH PROPOFOL N/A 12/16/2015   Procedure: COLONOSCOPY WITH PROPOFOL;  Surgeon: Scot Jun, MD;  Location: [facility] ENDOSCOPY;  Service: Endoscopy;  Laterality: N/A;   COLONOSCOPY WITH PROPOFOL N/A 12/19/2021   Procedure: COLONOSCOPY WITH PROPOFOL;  Surgeon: Regis Bill, MD;  Location: [facility] ENDOSCOPY;  Service: Endoscopy;  Laterality: N/A;   DILATATION & CURETTAGE/HYSTEROSCOPY WITH MYOSURE N/A 07/01/2015   Procedure: DILATATION & CURETTAGE/HYSTEROSCOPY WITH MYOSURE;  Surgeon: Suzy Bouchard, MD;  Location: [facility] ORS;  Service: Gynecology;  Laterality: N/A;   DILATION AND CURETTAGE OF UTERUS     ESOPHAGOGASTRODUODENOSCOPY (EGD) WITH PROPOFOL N/A 12/19/2021   Procedure: ESOPHAGOGASTRODUODENOSCOPY (EGD) WITH PROPOFOL;  Surgeon: Regis Bill, MD;  Location: [facility] ENDOSCOPY;  Service: Endoscopy;  Laterality: N/A;   EYE SURGERY     FOR GLAUCOMA   PHOTOCOAGULATION Left 03/19/2016   Procedure: PHOTOCOAGULATION;  Surgeon: Sherald Hess, MD;  Location: [facility];  Service: Ophthalmology;  Laterality: Left;  Diabetic - oral meds CANNOT arrive before 8AM IVA Block   SENTINEL LYMPH NODE BIOPSY Left 2010   TOE SURGERY      FAMILY HISTORY :   Family History  Problem Relation Age of Onset   Diabetes Mother    Diabetes Father    Arthritis Sister    Diabetes Sister    Breast cancer Sister 32   Breast cancer Sister 52   Diabetes Brother    Breast cancer Other     SOCIAL HISTORY:   Social History   Tobacco Use   Smoking status: Never   Smokeless tobacco: Never  Substance Use Topics   Alcohol use: No   Drug use: No    ALLERGIES:  is allergic to brimonidine, dorzolamide, flagyl [metronidazole], lisinopril, naprosyn [naproxen], tegaderm ag mesh [silver], doxycycline, latex, and tape.  MEDICATIONS:  Current Outpatient Medications  Medication Sig Dispense Refill   acetaminophen (TYLENOL) 500 MG tablet Take 500 mg by mouth every 6 (six) hours as needed.     aspirin 81 MG tablet Take 81 mg by mouth daily.     Calcium Carb-Cholecalciferol  (CALCIUM 500/VITAMIN D) 500-3.125 MG-MCG TABS Take 1 tablet by mouth daily.     Ferrous Sulfate (IRON) 325 (65 FE) MG TABS Take 1 tablet by mouth daily.     fexofenadine (ALLEGRA) 180 MG tablet Take 180 mg by mouth daily.     fish oil-omega-3 fatty acids 1000 MG capsule Take 2 g by mouth daily.     fluticasone (FLONASE) 50 MCG/ACT nasal spray Place 2 sprays into both nostrils daily. As needed     glipiZIDE (GLUCOTROL) 5 MG tablet Take 5 mg by mouth 2 (two) times daily before a meal.     latanoprost (XALATAN) 0.005 % ophthalmic solution Place 1 drop into both eyes at bedtime.     metFORMIN (GLUCOPHAGE) 1000 MG tablet Take 1,000 mg by mouth 2 (two) times daily.     Multiple Vitamin (MULTIVITAMIN) capsule Take 1 capsule by mouth daily.  OZEMPIC, 1 MG/DOSE, 4 MG/3ML SOPN Inject 1 mg into the skin once a week.     pantoprazole (PROTONIX) 40 MG tablet Take 40 mg by mouth daily.     rosuvastatin (CRESTOR) 10 MG tablet Take 5 mg by mouth daily. 1/2 of 10 mg tablet daily     solifenacin (VESICARE) 5 MG tablet Take 5 mg by mouth daily.     timolol (TIMOPTIC) 0.5 % ophthalmic solution Place 1 drop into the left eye 2 (two) times daily.     LINZESS 72 MCG capsule Take 72 mcg by mouth daily. (Patient not taking: Reported on 12/18/2022)     sitaGLIPtin-metformin (JANUMET) 50-1000 MG tablet Take 1 tablet by mouth 2 (two) times daily with a meal. (Patient not taking: Reported on 03/13/2022)     No current facility-administered medications for this visit.    PHYSICAL EXAMINATION: ECOG PERFORMANCE STATUS: 0 - Asymptomatic  BP 132/80 (BP Location: Right Arm, Patient Position: Sitting, Cuff Size: Large)   Pulse 84   Temp 98.9 F (37.2 C) (Tympanic)   Ht 5\' 2"  (1.575 m)   Wt 215 lb (97.5 kg)   SpO2 98%   BMI 39.32 kg/m   Filed Weights   12/18/22 1343  Weight: 215 lb (97.5 kg)     Physical Exam HENT:     Head: Normocephalic and atraumatic.     Mouth/Throat:     Pharynx: No oropharyngeal  exudate.  Eyes:     Pupils: Pupils are equal, round, and reactive to light.  Cardiovascular:     Rate and Rhythm: Normal rate and regular rhythm.  Pulmonary:     Effort: No respiratory distress.     Breath sounds: No wheezing.  Abdominal:     General: Bowel sounds are normal. There is no distension.     Palpations: Abdomen is soft. There is no mass.     Tenderness: There is no abdominal tenderness. There is no guarding or rebound.  Musculoskeletal:        General: No tenderness. Normal range of motion.     Cervical back: Normal range of motion and neck supple.  Skin:    General: Skin is warm.  Neurological:     Mental Status: She is alert and oriented to person, place, and time.  Psychiatric:        Mood and Affect: Affect normal.     Breast exam- defred.  LABORATORY DATA:  I have reviewed the data as listed    Component Value Date/Time   NA 144 03/13/2022 1413   NA 140 10/01/2011 1349   K 4.4 03/13/2022 1413   K 4.5 10/01/2011 1349   CL 110 03/13/2022 1413   CL 104 10/01/2011 1349   CO2 22 03/13/2022 1413   CO2 27 10/01/2011 1349   GLUCOSE 133 (H) 03/13/2022 1413   GLUCOSE 138 (H) 10/01/2011 1349   BUN 24 (H) 03/13/2022 1413   BUN 16 10/01/2011 1349   CREATININE 1.21 (H) 03/13/2022 1413   CREATININE 0.89 01/06/2014 0938   CALCIUM 9.3 03/13/2022 1413   CALCIUM 10.1 10/01/2011 1349   PROT 7.5 03/13/2022 1413   PROT 7.1 01/06/2014 0938   ALBUMIN 3.5 03/13/2022 1413   ALBUMIN 3.2 (L) 01/06/2014 0938   AST 21 03/13/2022 1413   AST 18 01/06/2014 0938   ALT 17 03/13/2022 1413   ALT 24 01/06/2014 0938   ALKPHOS 71 03/13/2022 1413   ALKPHOS 84 01/06/2014 0938   BILITOT 0.5 03/13/2022 1413  BILITOT 0.3 01/06/2014 0938   GFRNONAA 47 (L) 03/13/2022 1413   GFRNONAA >60 01/06/2014 0938   GFRNONAA 60 (L) 10/01/2012 0912   GFRAA 53 (L) 09/02/2019 1424   GFRAA >60 01/06/2014 0938   GFRAA >60 10/01/2012 0912    No results found for: "SPEP", "UPEP"  Lab Results   Component Value Date   WBC 9.6 03/13/2022   NEUTROABS 5.5 03/13/2022   HGB 11.4 (L) 03/13/2022   HCT 36.1 03/13/2022   MCV 88.3 03/13/2022   PLT 290 03/13/2022      Chemistry      Component Value Date/Time   NA 144 03/13/2022 1413   NA 140 10/01/2011 1349   K 4.4 03/13/2022 1413   K 4.5 10/01/2011 1349   CL 110 03/13/2022 1413   CL 104 10/01/2011 1349   CO2 22 03/13/2022 1413   CO2 27 10/01/2011 1349   BUN 24 (H) 03/13/2022 1413   BUN 16 10/01/2011 1349   CREATININE 1.21 (H) 03/13/2022 1413   CREATININE 0.89 01/06/2014 0938      Component Value Date/Time   CALCIUM 9.3 03/13/2022 1413   CALCIUM 10.1 10/01/2011 1349   ALKPHOS 71 03/13/2022 1413   ALKPHOS 84 01/06/2014 0938   AST 21 03/13/2022 1413   AST 18 01/06/2014 0938   ALT 17 03/13/2022 1413   ALT 24 01/06/2014 0938   BILITOT 0.5 03/13/2022 1413   BILITOT 0.3 01/06/2014 0938       RADIOGRAPHIC STUDIES: I have personally reviewed the radiological images as listed and agreed with the findings in the report. No results found.   ASSESSMENT & PLAN:  Carcinoma of overlapping sites of left breast in female, estrogen receptor negative (HCC) # LEFT BREAST -  Stage I triple breast cancer status post lumpectomy; radiation [Dr.Byrnett]  STABLE; mammo June 7th 2023  WNL. Ca-27-29-pending. Would NOT recommend checking ca27-29 as patient is essentially cured of her cancer.  However recommended patient continue surveillance physical exams/mammograms on a yearly basis.  # chronic Anemia-unclear etiology/CKD- stage III- On p.o. iron.  Hemoglobin between 10.6- SEP 2024- [facility]- I sat-26; 86- proceed with venofer given up coming Knee surgery [on Nov 13th]  #Chronic left breast neuropathic pain-  stable.   # CKD stage III GFR 45. Stable/..   # BMD-April 2021- Osteopenia [[facility]]- On ca+vitD- continue exercise; defer to PCP for further follow up.   # DISPOSITION: # no labs today.  # venofer next week.   # follow up as needed- -Dr.B.    CC: [name].    No orders of the defined types were placed in this encounter.  All questions were answered. The patient knows to call the clinic with any problems, questions or concerns.      Earna Coder, MD 12/18/2022 2:52 PM

## 2022-12-18 NOTE — Progress Notes (Signed)
Patient is doing good, except for her left knee pain which she rates at about a 5.

## 2022-12-18 NOTE — Assessment & Plan Note (Addendum)
#   LEFT BREAST -  Stage I triple breast cancer status post lumpectomy; radiation [Dr.Byrnett]  STABLE; mammo June 7th 2023  WNL. Ca-27-29-pending. Would NOT recommend checking ca27-29 as patient is essentially cured of her cancer.  However recommended patient continue surveillance physical exams/mammograms on a yearly basis.  # chronic Anemia-unclear etiology/CKD- stage III- On p.o. iron.  Hemoglobin between 10.6- SEP 2024- [facility]- I sat-26; 86- proceed with venofer given up coming Knee surgery [on Nov 13th]  #Chronic left breast neuropathic pain-  stable.   # CKD stage III GFR 45. Stable/..   # BMD-April 2021- Osteopenia [[facility]]- On ca+vitD- continue exercise; defer to PCP for further follow up.   # DISPOSITION: # no labs today.  # venofer next week.   # follow up as needed- -Dr.B.   CC: [name].

## 2022-12-24 ENCOUNTER — Inpatient Hospital Stay: Payer: Medicare HMO

## 2022-12-24 VITALS — BP 137/71 | HR 88 | Temp 97.0°F

## 2022-12-24 DIAGNOSIS — D649 Anemia, unspecified: Secondary | ICD-10-CM | POA: Diagnosis not present

## 2022-12-24 DIAGNOSIS — Z853 Personal history of malignant neoplasm of breast: Secondary | ICD-10-CM | POA: Diagnosis not present

## 2022-12-24 DIAGNOSIS — Z923 Personal history of irradiation: Secondary | ICD-10-CM | POA: Diagnosis not present

## 2022-12-24 DIAGNOSIS — Z9221 Personal history of antineoplastic chemotherapy: Secondary | ICD-10-CM | POA: Diagnosis not present

## 2022-12-24 DIAGNOSIS — N644 Mastodynia: Secondary | ICD-10-CM | POA: Diagnosis not present

## 2022-12-24 DIAGNOSIS — Z803 Family history of malignant neoplasm of breast: Secondary | ICD-10-CM | POA: Diagnosis not present

## 2022-12-24 DIAGNOSIS — E1122 Type 2 diabetes mellitus with diabetic chronic kidney disease: Secondary | ICD-10-CM | POA: Diagnosis not present

## 2022-12-24 DIAGNOSIS — C50919 Malignant neoplasm of unspecified site of unspecified female breast: Secondary | ICD-10-CM

## 2022-12-24 DIAGNOSIS — N183 Chronic kidney disease, stage 3 unspecified: Secondary | ICD-10-CM | POA: Diagnosis not present

## 2022-12-24 DIAGNOSIS — M8580 Other specified disorders of bone density and structure, unspecified site: Secondary | ICD-10-CM | POA: Diagnosis not present

## 2022-12-24 MED ORDER — SODIUM CHLORIDE 0.9 % IV SOLN
Freq: Once | INTRAVENOUS | Status: AC
  Filled 2022-12-24: qty 250

## 2022-12-24 MED ORDER — SODIUM CHLORIDE 0.9 % IV SOLN
200.0000 mg | Freq: Once | INTRAVENOUS | Status: AC
  Administered 2022-12-24: 200 mg via INTRAVENOUS
  Filled 2022-12-24: qty 200

## 2022-12-24 MED ORDER — SODIUM CHLORIDE 0.9% FLUSH
10.0000 mL | Freq: Once | INTRAVENOUS | Status: AC | PRN
  Administered 2022-12-24: 10 mL
  Filled 2022-12-24: qty 10

## 2022-12-24 NOTE — Progress Notes (Signed)
Patient tolerated initial infusion well, no questions/concerns voiced. Monitored 30 min post transfusion. Patient stable at discharge. VSS. AVS given.

## 2022-12-24 NOTE — Patient Instructions (Signed)
Iron Sucrose Injection What is this medication? IRON SUCROSE (EYE ern SOO krose) treats low levels of iron (iron deficiency anemia) in people with kidney disease. Iron is a mineral that plays an important role in making red blood cells, which carry oxygen from your lungs to the rest of your body. This medicine may be used for other purposes; ask your health care provider or pharmacist if you have questions. COMMON BRAND NAME(S): Venofer What should I tell my care team before I take this medication? They need to know if you have any of these conditions: Anemia not caused by low iron levels Heart disease High levels of iron in the blood Kidney disease Liver disease An unusual or allergic reaction to iron, other medications, foods, dyes, or preservatives Pregnant or trying to get pregnant Breastfeeding How should I use this medication? This medication is for infusion into a vein. It is given in a hospital or clinic setting. Talk to your care team about the use of this medication in children. While this medication may be prescribed for children as young as 2 years for selected conditions, precautions do apply. Overdosage: If you think you have taken too much of this medicine contact a poison control center or emergency room at once. NOTE: This medicine is only for you. Do not share this medicine with others. What if I miss a dose? Keep appointments for follow-up doses. It is important not to miss your dose. Call your care team if you are unable to keep an appointment. What may interact with this medication? Do not take this medication with any of the following: Deferoxamine Dimercaprol Other iron products This medication may also interact with the following: Chloramphenicol Deferasirox This list may not describe all possible interactions. Give your health care provider a list of all the medicines, herbs, non-prescription drugs, or dietary supplements you use. Also tell them if you smoke,  drink alcohol, or use illegal drugs. Some items may interact with your medicine. What should I watch for while using this medication? Visit your care team regularly. Tell your care team if your symptoms do not start to get better or if they get worse. You may need blood work done while you are taking this medication. You may need to follow a special diet. Talk to your care team. Foods that contain iron include: whole grains/cereals, dried fruits, beans, or peas, leafy green vegetables, and organ meats (liver, kidney). What side effects may I notice from receiving this medication? Side effects that you should report to your care team as soon as possible: Allergic reactions--skin rash, itching, hives, swelling of the face, lips, tongue, or throat Low blood pressure--dizziness, feeling faint or lightheaded, blurry vision Shortness of breath Side effects that usually do not require medical attention (report to your care team if they continue or are bothersome): Flushing Headache Joint pain Muscle pain Nausea Pain, redness, or irritation at injection site This list may not describe all possible side effects. Call your doctor for medical advice about side effects. You may report side effects to FDA at 1-800-FDA-1088. Where should I keep my medication? This medication is given in a hospital or clinic. It will not be stored at home. NOTE: This sheet is a summary. It may not cover all possible information. If you have questions about this medicine, talk to your doctor, pharmacist, or health care provider.  2024 Elsevier/Gold Standard (2022-08-24 00:00:00)

## 2023-01-23 DIAGNOSIS — I10 Essential (primary) hypertension: Secondary | ICD-10-CM | POA: Diagnosis not present

## 2023-01-23 DIAGNOSIS — E785 Hyperlipidemia, unspecified: Secondary | ICD-10-CM | POA: Diagnosis not present

## 2023-01-23 DIAGNOSIS — E1140 Type 2 diabetes mellitus with diabetic neuropathy, unspecified: Secondary | ICD-10-CM | POA: Diagnosis not present

## 2023-01-30 DIAGNOSIS — N1832 Chronic kidney disease, stage 3b: Secondary | ICD-10-CM | POA: Diagnosis not present

## 2023-01-30 DIAGNOSIS — I129 Hypertensive chronic kidney disease with stage 1 through stage 4 chronic kidney disease, or unspecified chronic kidney disease: Secondary | ICD-10-CM | POA: Diagnosis not present

## 2023-01-30 DIAGNOSIS — E785 Hyperlipidemia, unspecified: Secondary | ICD-10-CM | POA: Diagnosis not present

## 2023-01-30 DIAGNOSIS — E1122 Type 2 diabetes mellitus with diabetic chronic kidney disease: Secondary | ICD-10-CM | POA: Diagnosis not present

## 2023-01-30 DIAGNOSIS — Z0000 Encounter for general adult medical examination without abnormal findings: Secondary | ICD-10-CM | POA: Diagnosis not present

## 2023-01-30 DIAGNOSIS — Z1231 Encounter for screening mammogram for malignant neoplasm of breast: Secondary | ICD-10-CM | POA: Diagnosis not present

## 2023-01-30 DIAGNOSIS — E1142 Type 2 diabetes mellitus with diabetic polyneuropathy: Secondary | ICD-10-CM | POA: Diagnosis not present

## 2023-01-30 DIAGNOSIS — Z853 Personal history of malignant neoplasm of breast: Secondary | ICD-10-CM | POA: Diagnosis not present

## 2023-02-19 DIAGNOSIS — H401123 Primary open-angle glaucoma, left eye, severe stage: Secondary | ICD-10-CM | POA: Diagnosis not present

## 2023-02-19 DIAGNOSIS — H401112 Primary open-angle glaucoma, right eye, moderate stage: Secondary | ICD-10-CM | POA: Diagnosis not present

## 2023-03-29 DIAGNOSIS — H25811 Combined forms of age-related cataract, right eye: Secondary | ICD-10-CM | POA: Diagnosis not present

## 2023-03-29 DIAGNOSIS — H401112 Primary open-angle glaucoma, right eye, moderate stage: Secondary | ICD-10-CM | POA: Diagnosis not present

## 2023-03-29 DIAGNOSIS — H401123 Primary open-angle glaucoma, left eye, severe stage: Secondary | ICD-10-CM | POA: Diagnosis not present

## 2023-03-29 DIAGNOSIS — E119 Type 2 diabetes mellitus without complications: Secondary | ICD-10-CM | POA: Diagnosis not present

## 2023-04-05 DIAGNOSIS — E1140 Type 2 diabetes mellitus with diabetic neuropathy, unspecified: Secondary | ICD-10-CM | POA: Diagnosis not present

## 2023-04-05 DIAGNOSIS — M19072 Primary osteoarthritis, left ankle and foot: Secondary | ICD-10-CM | POA: Diagnosis not present

## 2023-04-05 DIAGNOSIS — R600 Localized edema: Secondary | ICD-10-CM | POA: Diagnosis not present

## 2023-04-05 DIAGNOSIS — S92142A Displaced dome fracture of left talus, initial encounter for closed fracture: Secondary | ICD-10-CM | POA: Diagnosis not present

## 2023-04-05 DIAGNOSIS — M25572 Pain in left ankle and joints of left foot: Secondary | ICD-10-CM | POA: Diagnosis not present

## 2023-04-05 DIAGNOSIS — M25472 Effusion, left ankle: Secondary | ICD-10-CM | POA: Diagnosis not present

## 2023-04-05 DIAGNOSIS — N1832 Chronic kidney disease, stage 3b: Secondary | ICD-10-CM | POA: Diagnosis not present

## 2023-04-09 DIAGNOSIS — D631 Anemia in chronic kidney disease: Secondary | ICD-10-CM | POA: Diagnosis not present

## 2023-04-09 DIAGNOSIS — Z01818 Encounter for other preprocedural examination: Secondary | ICD-10-CM | POA: Diagnosis not present

## 2023-04-09 DIAGNOSIS — D649 Anemia, unspecified: Secondary | ICD-10-CM | POA: Diagnosis not present

## 2023-04-09 DIAGNOSIS — K219 Gastro-esophageal reflux disease without esophagitis: Secondary | ICD-10-CM | POA: Diagnosis not present

## 2023-04-09 DIAGNOSIS — Z6836 Body mass index (BMI) 36.0-36.9, adult: Secondary | ICD-10-CM | POA: Diagnosis not present

## 2023-04-09 DIAGNOSIS — R600 Localized edema: Secondary | ICD-10-CM | POA: Diagnosis not present

## 2023-04-09 DIAGNOSIS — E1140 Type 2 diabetes mellitus with diabetic neuropathy, unspecified: Secondary | ICD-10-CM | POA: Diagnosis not present

## 2023-04-09 DIAGNOSIS — M1712 Unilateral primary osteoarthritis, left knee: Secondary | ICD-10-CM | POA: Diagnosis not present

## 2023-04-09 DIAGNOSIS — N183 Chronic kidney disease, stage 3 unspecified: Secondary | ICD-10-CM | POA: Diagnosis not present

## 2023-04-09 DIAGNOSIS — E1122 Type 2 diabetes mellitus with diabetic chronic kidney disease: Secondary | ICD-10-CM | POA: Diagnosis not present

## 2023-04-09 DIAGNOSIS — E669 Obesity, unspecified: Secondary | ICD-10-CM | POA: Diagnosis not present

## 2023-04-17 DIAGNOSIS — J069 Acute upper respiratory infection, unspecified: Secondary | ICD-10-CM | POA: Diagnosis not present

## 2023-04-17 DIAGNOSIS — R600 Localized edema: Secondary | ICD-10-CM | POA: Diagnosis not present

## 2023-04-24 DIAGNOSIS — D649 Anemia, unspecified: Secondary | ICD-10-CM | POA: Diagnosis not present

## 2023-04-24 DIAGNOSIS — Z6838 Body mass index (BMI) 38.0-38.9, adult: Secondary | ICD-10-CM | POA: Diagnosis not present

## 2023-04-24 DIAGNOSIS — Z7984 Long term (current) use of oral hypoglycemic drugs: Secondary | ICD-10-CM | POA: Diagnosis not present

## 2023-04-24 DIAGNOSIS — K219 Gastro-esophageal reflux disease without esophagitis: Secondary | ICD-10-CM | POA: Diagnosis not present

## 2023-04-24 DIAGNOSIS — D631 Anemia in chronic kidney disease: Secondary | ICD-10-CM | POA: Diagnosis not present

## 2023-04-24 DIAGNOSIS — M25762 Osteophyte, left knee: Secondary | ICD-10-CM | POA: Diagnosis not present

## 2023-04-24 DIAGNOSIS — M1712 Unilateral primary osteoarthritis, left knee: Secondary | ICD-10-CM | POA: Diagnosis not present

## 2023-04-24 DIAGNOSIS — G8918 Other acute postprocedural pain: Secondary | ICD-10-CM | POA: Diagnosis not present

## 2023-04-24 DIAGNOSIS — Z96652 Presence of left artificial knee joint: Secondary | ICD-10-CM | POA: Diagnosis not present

## 2023-04-24 DIAGNOSIS — E1142 Type 2 diabetes mellitus with diabetic polyneuropathy: Secondary | ICD-10-CM | POA: Diagnosis not present

## 2023-04-24 DIAGNOSIS — E1122 Type 2 diabetes mellitus with diabetic chronic kidney disease: Secondary | ICD-10-CM | POA: Diagnosis not present

## 2023-04-24 DIAGNOSIS — N183 Chronic kidney disease, stage 3 unspecified: Secondary | ICD-10-CM | POA: Diagnosis not present

## 2023-04-24 DIAGNOSIS — Z794 Long term (current) use of insulin: Secondary | ICD-10-CM | POA: Diagnosis not present

## 2023-04-24 DIAGNOSIS — E669 Obesity, unspecified: Secondary | ICD-10-CM | POA: Diagnosis not present

## 2023-04-24 DIAGNOSIS — M24562 Contracture, left knee: Secondary | ICD-10-CM | POA: Diagnosis not present

## 2023-05-14 DIAGNOSIS — M25462 Effusion, left knee: Secondary | ICD-10-CM | POA: Diagnosis not present

## 2023-05-14 DIAGNOSIS — M2170 Unequal limb length (acquired), unspecified site: Secondary | ICD-10-CM | POA: Diagnosis not present

## 2023-05-14 DIAGNOSIS — Z96652 Presence of left artificial knee joint: Secondary | ICD-10-CM | POA: Diagnosis not present

## 2023-05-14 DIAGNOSIS — M21161 Varus deformity, not elsewhere classified, right knee: Secondary | ICD-10-CM | POA: Diagnosis not present

## 2023-05-27 DIAGNOSIS — M25662 Stiffness of left knee, not elsewhere classified: Secondary | ICD-10-CM | POA: Diagnosis not present

## 2023-05-27 DIAGNOSIS — Z96652 Presence of left artificial knee joint: Secondary | ICD-10-CM | POA: Diagnosis not present

## 2023-05-27 DIAGNOSIS — M25562 Pain in left knee: Secondary | ICD-10-CM | POA: Diagnosis not present

## 2023-05-27 DIAGNOSIS — M6281 Muscle weakness (generalized): Secondary | ICD-10-CM | POA: Diagnosis not present

## 2023-05-28 DIAGNOSIS — M6281 Muscle weakness (generalized): Secondary | ICD-10-CM | POA: Diagnosis not present

## 2023-05-28 DIAGNOSIS — M25662 Stiffness of left knee, not elsewhere classified: Secondary | ICD-10-CM | POA: Diagnosis not present

## 2023-05-28 DIAGNOSIS — Z96652 Presence of left artificial knee joint: Secondary | ICD-10-CM | POA: Diagnosis not present

## 2023-05-28 DIAGNOSIS — M25562 Pain in left knee: Secondary | ICD-10-CM | POA: Diagnosis not present

## 2023-05-31 DIAGNOSIS — Z96652 Presence of left artificial knee joint: Secondary | ICD-10-CM | POA: Diagnosis not present

## 2023-05-31 DIAGNOSIS — M25662 Stiffness of left knee, not elsewhere classified: Secondary | ICD-10-CM | POA: Diagnosis not present

## 2023-05-31 DIAGNOSIS — M6281 Muscle weakness (generalized): Secondary | ICD-10-CM | POA: Diagnosis not present

## 2023-05-31 DIAGNOSIS — M25562 Pain in left knee: Secondary | ICD-10-CM | POA: Diagnosis not present

## 2023-06-03 DIAGNOSIS — M6281 Muscle weakness (generalized): Secondary | ICD-10-CM | POA: Diagnosis not present

## 2023-06-03 DIAGNOSIS — M25562 Pain in left knee: Secondary | ICD-10-CM | POA: Diagnosis not present

## 2023-06-03 DIAGNOSIS — M25662 Stiffness of left knee, not elsewhere classified: Secondary | ICD-10-CM | POA: Diagnosis not present

## 2023-06-03 DIAGNOSIS — Z96652 Presence of left artificial knee joint: Secondary | ICD-10-CM | POA: Diagnosis not present

## 2023-06-05 DIAGNOSIS — M6281 Muscle weakness (generalized): Secondary | ICD-10-CM | POA: Diagnosis not present

## 2023-06-05 DIAGNOSIS — M25662 Stiffness of left knee, not elsewhere classified: Secondary | ICD-10-CM | POA: Diagnosis not present

## 2023-06-05 DIAGNOSIS — Z96652 Presence of left artificial knee joint: Secondary | ICD-10-CM | POA: Diagnosis not present

## 2023-06-05 DIAGNOSIS — M25562 Pain in left knee: Secondary | ICD-10-CM | POA: Diagnosis not present

## 2023-06-07 DIAGNOSIS — M25562 Pain in left knee: Secondary | ICD-10-CM | POA: Diagnosis not present

## 2023-06-07 DIAGNOSIS — M6281 Muscle weakness (generalized): Secondary | ICD-10-CM | POA: Diagnosis not present

## 2023-06-07 DIAGNOSIS — M25662 Stiffness of left knee, not elsewhere classified: Secondary | ICD-10-CM | POA: Diagnosis not present

## 2023-06-07 DIAGNOSIS — Z96652 Presence of left artificial knee joint: Secondary | ICD-10-CM | POA: Diagnosis not present

## 2023-06-10 DIAGNOSIS — Z96652 Presence of left artificial knee joint: Secondary | ICD-10-CM | POA: Diagnosis not present

## 2023-06-10 DIAGNOSIS — M19071 Primary osteoarthritis, right ankle and foot: Secondary | ICD-10-CM | POA: Diagnosis not present

## 2023-06-10 DIAGNOSIS — M25562 Pain in left knee: Secondary | ICD-10-CM | POA: Diagnosis not present

## 2023-06-10 DIAGNOSIS — M6281 Muscle weakness (generalized): Secondary | ICD-10-CM | POA: Diagnosis not present

## 2023-06-10 DIAGNOSIS — M25662 Stiffness of left knee, not elsewhere classified: Secondary | ICD-10-CM | POA: Diagnosis not present

## 2023-06-12 DIAGNOSIS — M25562 Pain in left knee: Secondary | ICD-10-CM | POA: Diagnosis not present

## 2023-06-12 DIAGNOSIS — M6281 Muscle weakness (generalized): Secondary | ICD-10-CM | POA: Diagnosis not present

## 2023-06-12 DIAGNOSIS — Z96652 Presence of left artificial knee joint: Secondary | ICD-10-CM | POA: Diagnosis not present

## 2023-06-14 DIAGNOSIS — M6281 Muscle weakness (generalized): Secondary | ICD-10-CM | POA: Diagnosis not present

## 2023-06-14 DIAGNOSIS — Z96652 Presence of left artificial knee joint: Secondary | ICD-10-CM | POA: Diagnosis not present

## 2023-06-14 DIAGNOSIS — M25562 Pain in left knee: Secondary | ICD-10-CM | POA: Diagnosis not present

## 2023-06-14 DIAGNOSIS — M25662 Stiffness of left knee, not elsewhere classified: Secondary | ICD-10-CM | POA: Diagnosis not present

## 2023-06-18 DIAGNOSIS — Z96652 Presence of left artificial knee joint: Secondary | ICD-10-CM | POA: Diagnosis not present

## 2023-06-18 DIAGNOSIS — M25562 Pain in left knee: Secondary | ICD-10-CM | POA: Diagnosis not present

## 2023-06-18 DIAGNOSIS — M6281 Muscle weakness (generalized): Secondary | ICD-10-CM | POA: Diagnosis not present

## 2023-06-18 DIAGNOSIS — M25662 Stiffness of left knee, not elsewhere classified: Secondary | ICD-10-CM | POA: Diagnosis not present

## 2023-06-20 DIAGNOSIS — M25662 Stiffness of left knee, not elsewhere classified: Secondary | ICD-10-CM | POA: Diagnosis not present

## 2023-06-20 DIAGNOSIS — M25562 Pain in left knee: Secondary | ICD-10-CM | POA: Diagnosis not present

## 2023-06-20 DIAGNOSIS — Z96652 Presence of left artificial knee joint: Secondary | ICD-10-CM | POA: Diagnosis not present

## 2023-06-20 DIAGNOSIS — M6281 Muscle weakness (generalized): Secondary | ICD-10-CM | POA: Diagnosis not present

## 2023-06-25 DIAGNOSIS — M25562 Pain in left knee: Secondary | ICD-10-CM | POA: Diagnosis not present

## 2023-06-25 DIAGNOSIS — Z96652 Presence of left artificial knee joint: Secondary | ICD-10-CM | POA: Diagnosis not present

## 2023-06-25 DIAGNOSIS — M6281 Muscle weakness (generalized): Secondary | ICD-10-CM | POA: Diagnosis not present

## 2023-06-25 DIAGNOSIS — M25662 Stiffness of left knee, not elsewhere classified: Secondary | ICD-10-CM | POA: Diagnosis not present

## 2023-06-27 DIAGNOSIS — Z96652 Presence of left artificial knee joint: Secondary | ICD-10-CM | POA: Diagnosis not present

## 2023-06-27 DIAGNOSIS — M25562 Pain in left knee: Secondary | ICD-10-CM | POA: Diagnosis not present

## 2023-06-27 DIAGNOSIS — M25662 Stiffness of left knee, not elsewhere classified: Secondary | ICD-10-CM | POA: Diagnosis not present

## 2023-06-27 DIAGNOSIS — M6281 Muscle weakness (generalized): Secondary | ICD-10-CM | POA: Diagnosis not present

## 2023-07-03 DIAGNOSIS — M6281 Muscle weakness (generalized): Secondary | ICD-10-CM | POA: Diagnosis not present

## 2023-07-03 DIAGNOSIS — M25562 Pain in left knee: Secondary | ICD-10-CM | POA: Diagnosis not present

## 2023-07-03 DIAGNOSIS — M25662 Stiffness of left knee, not elsewhere classified: Secondary | ICD-10-CM | POA: Diagnosis not present

## 2023-07-03 DIAGNOSIS — Z96652 Presence of left artificial knee joint: Secondary | ICD-10-CM | POA: Diagnosis not present

## 2023-07-05 DIAGNOSIS — M25662 Stiffness of left knee, not elsewhere classified: Secondary | ICD-10-CM | POA: Diagnosis not present

## 2023-07-05 DIAGNOSIS — M25562 Pain in left knee: Secondary | ICD-10-CM | POA: Diagnosis not present

## 2023-07-05 DIAGNOSIS — Z96652 Presence of left artificial knee joint: Secondary | ICD-10-CM | POA: Diagnosis not present

## 2023-07-05 DIAGNOSIS — M6281 Muscle weakness (generalized): Secondary | ICD-10-CM | POA: Diagnosis not present

## 2023-07-09 DIAGNOSIS — M25662 Stiffness of left knee, not elsewhere classified: Secondary | ICD-10-CM | POA: Diagnosis not present

## 2023-07-09 DIAGNOSIS — M6281 Muscle weakness (generalized): Secondary | ICD-10-CM | POA: Diagnosis not present

## 2023-07-09 DIAGNOSIS — M25562 Pain in left knee: Secondary | ICD-10-CM | POA: Diagnosis not present

## 2023-07-09 DIAGNOSIS — Z96652 Presence of left artificial knee joint: Secondary | ICD-10-CM | POA: Diagnosis not present

## 2023-07-11 DIAGNOSIS — Z96652 Presence of left artificial knee joint: Secondary | ICD-10-CM | POA: Diagnosis not present

## 2023-07-11 DIAGNOSIS — M25662 Stiffness of left knee, not elsewhere classified: Secondary | ICD-10-CM | POA: Diagnosis not present

## 2023-07-11 DIAGNOSIS — M25562 Pain in left knee: Secondary | ICD-10-CM | POA: Diagnosis not present

## 2023-07-11 DIAGNOSIS — M6281 Muscle weakness (generalized): Secondary | ICD-10-CM | POA: Diagnosis not present

## 2023-07-15 DIAGNOSIS — M25562 Pain in left knee: Secondary | ICD-10-CM | POA: Diagnosis not present

## 2023-07-15 DIAGNOSIS — Z96652 Presence of left artificial knee joint: Secondary | ICD-10-CM | POA: Diagnosis not present

## 2023-07-17 DIAGNOSIS — Z96652 Presence of left artificial knee joint: Secondary | ICD-10-CM | POA: Diagnosis not present

## 2023-07-17 DIAGNOSIS — M25562 Pain in left knee: Secondary | ICD-10-CM | POA: Diagnosis not present

## 2023-07-23 DIAGNOSIS — M25562 Pain in left knee: Secondary | ICD-10-CM | POA: Diagnosis not present

## 2023-07-23 DIAGNOSIS — M6281 Muscle weakness (generalized): Secondary | ICD-10-CM | POA: Diagnosis not present

## 2023-07-23 DIAGNOSIS — Z96652 Presence of left artificial knee joint: Secondary | ICD-10-CM | POA: Diagnosis not present

## 2023-07-23 DIAGNOSIS — M25662 Stiffness of left knee, not elsewhere classified: Secondary | ICD-10-CM | POA: Diagnosis not present

## 2023-07-24 DIAGNOSIS — E785 Hyperlipidemia, unspecified: Secondary | ICD-10-CM | POA: Diagnosis not present

## 2023-07-24 DIAGNOSIS — E1140 Type 2 diabetes mellitus with diabetic neuropathy, unspecified: Secondary | ICD-10-CM | POA: Diagnosis not present

## 2023-07-24 DIAGNOSIS — I10 Essential (primary) hypertension: Secondary | ICD-10-CM | POA: Diagnosis not present

## 2023-07-25 DIAGNOSIS — M6281 Muscle weakness (generalized): Secondary | ICD-10-CM | POA: Diagnosis not present

## 2023-07-25 DIAGNOSIS — M25562 Pain in left knee: Secondary | ICD-10-CM | POA: Diagnosis not present

## 2023-07-25 DIAGNOSIS — M25662 Stiffness of left knee, not elsewhere classified: Secondary | ICD-10-CM | POA: Diagnosis not present

## 2023-07-25 DIAGNOSIS — Z96652 Presence of left artificial knee joint: Secondary | ICD-10-CM | POA: Diagnosis not present

## 2023-07-30 DIAGNOSIS — M25662 Stiffness of left knee, not elsewhere classified: Secondary | ICD-10-CM | POA: Diagnosis not present

## 2023-07-30 DIAGNOSIS — Z96652 Presence of left artificial knee joint: Secondary | ICD-10-CM | POA: Diagnosis not present

## 2023-07-30 DIAGNOSIS — M25562 Pain in left knee: Secondary | ICD-10-CM | POA: Diagnosis not present

## 2023-07-30 DIAGNOSIS — M6281 Muscle weakness (generalized): Secondary | ICD-10-CM | POA: Diagnosis not present

## 2023-07-31 ENCOUNTER — Other Ambulatory Visit: Payer: Self-pay | Admitting: Physician Assistant

## 2023-07-31 DIAGNOSIS — N1832 Chronic kidney disease, stage 3b: Secondary | ICD-10-CM | POA: Diagnosis not present

## 2023-07-31 DIAGNOSIS — R8290 Unspecified abnormal findings in urine: Secondary | ICD-10-CM | POA: Diagnosis not present

## 2023-07-31 DIAGNOSIS — Z1231 Encounter for screening mammogram for malignant neoplasm of breast: Secondary | ICD-10-CM

## 2023-07-31 DIAGNOSIS — E66812 Obesity, class 2: Secondary | ICD-10-CM | POA: Diagnosis not present

## 2023-07-31 DIAGNOSIS — Z0000 Encounter for general adult medical examination without abnormal findings: Secondary | ICD-10-CM | POA: Diagnosis not present

## 2023-07-31 DIAGNOSIS — E1140 Type 2 diabetes mellitus with diabetic neuropathy, unspecified: Secondary | ICD-10-CM | POA: Diagnosis not present

## 2023-07-31 DIAGNOSIS — H9190 Unspecified hearing loss, unspecified ear: Secondary | ICD-10-CM | POA: Diagnosis not present

## 2023-07-31 DIAGNOSIS — E785 Hyperlipidemia, unspecified: Secondary | ICD-10-CM | POA: Diagnosis not present

## 2023-07-31 DIAGNOSIS — E1122 Type 2 diabetes mellitus with diabetic chronic kidney disease: Secondary | ICD-10-CM | POA: Diagnosis not present

## 2023-08-01 DIAGNOSIS — Z96652 Presence of left artificial knee joint: Secondary | ICD-10-CM | POA: Diagnosis not present

## 2023-08-01 DIAGNOSIS — M6281 Muscle weakness (generalized): Secondary | ICD-10-CM | POA: Diagnosis not present

## 2023-08-01 DIAGNOSIS — M25562 Pain in left knee: Secondary | ICD-10-CM | POA: Diagnosis not present

## 2023-08-01 DIAGNOSIS — M25662 Stiffness of left knee, not elsewhere classified: Secondary | ICD-10-CM | POA: Diagnosis not present

## 2023-08-06 DIAGNOSIS — M6281 Muscle weakness (generalized): Secondary | ICD-10-CM | POA: Diagnosis not present

## 2023-08-06 DIAGNOSIS — M25562 Pain in left knee: Secondary | ICD-10-CM | POA: Diagnosis not present

## 2023-08-06 DIAGNOSIS — Z96652 Presence of left artificial knee joint: Secondary | ICD-10-CM | POA: Diagnosis not present

## 2023-08-06 DIAGNOSIS — M25662 Stiffness of left knee, not elsewhere classified: Secondary | ICD-10-CM | POA: Diagnosis not present

## 2023-08-08 DIAGNOSIS — M6281 Muscle weakness (generalized): Secondary | ICD-10-CM | POA: Diagnosis not present

## 2023-08-08 DIAGNOSIS — M25562 Pain in left knee: Secondary | ICD-10-CM | POA: Diagnosis not present

## 2023-08-08 DIAGNOSIS — Z96652 Presence of left artificial knee joint: Secondary | ICD-10-CM | POA: Diagnosis not present

## 2023-08-08 DIAGNOSIS — M25662 Stiffness of left knee, not elsewhere classified: Secondary | ICD-10-CM | POA: Diagnosis not present

## 2023-08-13 DIAGNOSIS — M6281 Muscle weakness (generalized): Secondary | ICD-10-CM | POA: Diagnosis not present

## 2023-08-13 DIAGNOSIS — M25662 Stiffness of left knee, not elsewhere classified: Secondary | ICD-10-CM | POA: Diagnosis not present

## 2023-08-13 DIAGNOSIS — Z96652 Presence of left artificial knee joint: Secondary | ICD-10-CM | POA: Diagnosis not present

## 2023-08-13 DIAGNOSIS — M25562 Pain in left knee: Secondary | ICD-10-CM | POA: Diagnosis not present

## 2023-08-15 DIAGNOSIS — Z96652 Presence of left artificial knee joint: Secondary | ICD-10-CM | POA: Diagnosis not present

## 2023-08-15 DIAGNOSIS — M6281 Muscle weakness (generalized): Secondary | ICD-10-CM | POA: Diagnosis not present

## 2023-08-15 DIAGNOSIS — M25662 Stiffness of left knee, not elsewhere classified: Secondary | ICD-10-CM | POA: Diagnosis not present

## 2023-08-15 DIAGNOSIS — M25562 Pain in left knee: Secondary | ICD-10-CM | POA: Diagnosis not present

## 2023-08-16 DIAGNOSIS — H8112 Benign paroxysmal vertigo, left ear: Secondary | ICD-10-CM | POA: Diagnosis not present

## 2023-08-16 DIAGNOSIS — H903 Sensorineural hearing loss, bilateral: Secondary | ICD-10-CM | POA: Diagnosis not present

## 2023-08-19 DIAGNOSIS — M6281 Muscle weakness (generalized): Secondary | ICD-10-CM | POA: Diagnosis not present

## 2023-08-19 DIAGNOSIS — Z96652 Presence of left artificial knee joint: Secondary | ICD-10-CM | POA: Diagnosis not present

## 2023-08-19 DIAGNOSIS — M25662 Stiffness of left knee, not elsewhere classified: Secondary | ICD-10-CM | POA: Diagnosis not present

## 2023-08-19 DIAGNOSIS — M25562 Pain in left knee: Secondary | ICD-10-CM | POA: Diagnosis not present

## 2023-08-20 DIAGNOSIS — H26491 Other secondary cataract, right eye: Secondary | ICD-10-CM | POA: Diagnosis not present

## 2023-08-20 DIAGNOSIS — H401123 Primary open-angle glaucoma, left eye, severe stage: Secondary | ICD-10-CM | POA: Diagnosis not present

## 2023-08-20 DIAGNOSIS — H401112 Primary open-angle glaucoma, right eye, moderate stage: Secondary | ICD-10-CM | POA: Diagnosis not present

## 2023-08-27 DIAGNOSIS — M6281 Muscle weakness (generalized): Secondary | ICD-10-CM | POA: Diagnosis not present

## 2023-08-27 DIAGNOSIS — M25662 Stiffness of left knee, not elsewhere classified: Secondary | ICD-10-CM | POA: Diagnosis not present

## 2023-08-27 DIAGNOSIS — Z96652 Presence of left artificial knee joint: Secondary | ICD-10-CM | POA: Diagnosis not present

## 2023-08-27 DIAGNOSIS — M25562 Pain in left knee: Secondary | ICD-10-CM | POA: Diagnosis not present

## 2023-09-27 DIAGNOSIS — H26491 Other secondary cataract, right eye: Secondary | ICD-10-CM | POA: Diagnosis not present

## 2023-10-07 ENCOUNTER — Ambulatory Visit
Admission: RE | Admit: 2023-10-07 | Discharge: 2023-10-07 | Disposition: A | Discharge status: Home / Self Care | Source: Ambulatory Visit | Attending: Physician Assistant | Admitting: Physician Assistant

## 2023-10-07 DIAGNOSIS — Z1231 Encounter for screening mammogram for malignant neoplasm of breast: Secondary | ICD-10-CM | POA: Diagnosis not present

## 2023-10-08 DIAGNOSIS — Z96652 Presence of left artificial knee joint: Secondary | ICD-10-CM | POA: Diagnosis not present

## 2023-10-08 DIAGNOSIS — M1712 Unilateral primary osteoarthritis, left knee: Secondary | ICD-10-CM | POA: Diagnosis not present

## 2023-12-03 DIAGNOSIS — L2989 Other pruritus: Secondary | ICD-10-CM | POA: Diagnosis not present

## 2023-12-03 DIAGNOSIS — R6889 Other general symptoms and signs: Secondary | ICD-10-CM | POA: Diagnosis not present

## 2023-12-03 DIAGNOSIS — L818 Other specified disorders of pigmentation: Secondary | ICD-10-CM | POA: Diagnosis not present

## 2023-12-31 DIAGNOSIS — H401123 Primary open-angle glaucoma, left eye, severe stage: Secondary | ICD-10-CM | POA: Diagnosis not present

## 2023-12-31 DIAGNOSIS — H401112 Primary open-angle glaucoma, right eye, moderate stage: Secondary | ICD-10-CM | POA: Diagnosis not present

## 2024-01-03 NOTE — Progress Notes (Signed)
 01/06/2024 11:13 AM   Selena Contreras 05/05/1945 [id number]  Referring provider: Marikay Eva POUR, PA [address] [facility] [address] [facility] [place],  [place] [address]  Urological history: 1. OAB - Gemtesa cost prohibitive  - Mrybetriq cost prohibitive   - solifenacin 5 mg daily   2. Urge incontinence - solifenacin 5 mg daily  Chief Complaint  Patient presents with   urge incontinence   HPI: Selena Contreras is a 78 y.o. woman who presents today for UTI.    Previous records reviewed.  She was seen yesterday at urgent care for URI and was started on amoxicillin last night.  Patient states that she has had urinary incontinence for several years but over the last 6 months she has noted that her incontinence is getting worse even taking the Vesicare 5 mg daily.  She is having 1-7 daytime voids, nocturia x 3 or more with a mild urge to urinate.  She has urge incontinence.  She leaks 1-2 times a day.  She wears 2 absorbent pads daily.  She does limit fluid intake.  And she does engage in toilet mapping.  She uses a bucket to urinate and at night next to the bed, because she has not been able to make it to the restroom before she leaks.  She is also having leakage when she goes to stand up.  She is also experiencing spraying of the urinary stream when she voids.  Patient denies any modifying or aggravating factors.  Patient denies any recent UTI's, gross hematuria, dysuria or suprapubic/flank pain.  Patient denies any fevers, chills, nausea or vomiting.    UA yellow clear, specific gravity 1.025, pH 5.5, trace heme, 0-5 squames, 0-5 WBCs, 6-10 RBCs and a few bacteria  Serum creatinine (07/2023) 1.1, eGFR 52  Hemoglobin A1c (07/2023) 6.8   PMH: Past Medical History:  Diagnosis Date   Anemia    Arthritis    hips, legs   Back pain    Breast cancer (HCC) 2010   LUMPECTOMY/ chemo/rad   Cancer [facility]) February 24,2010   Left breast:  T1 C., N0, M0, triple negative  treated with wide excision, mastoplasty and sentinel node biopsy in February 2010   Cataracts, both eyes    Complication of anesthesia    hard to put to sleep needs more   Diabetes mellitus without complication (HCC)    AGE 52   Glaucoma    Glaucoma    Hollenhorst plaque, both eyes    Hypertension    Morbid obesity (HCC)    Neuropathy 2010   Personal history of chemotherapy    Personal history of radiation therapy    S/P chemotherapy, time since greater than 12 weeks    left breast   S/P radiation therapy > 12 wks ago    left breast cancer 2010   Ulcer    STOMACH    Surgical History: Past Surgical History:  Procedure Laterality Date   BREAST BIOPSY Left    negative 2007   BREAST BIOPSY Right 07/2015   complex sclerosing lesion   BREAST BIOPSY Right 07/27/2015   COMPLEX SCLEROSING LESION WITH USUAL DUCTAL HYPERPLASIA AND CALCIFICATIONS   BREAST CYST ASPIRATION Right    2002   BREAST EXCISIONAL BIOPSY Left    positive 2010   BREAST LUMPECTOMY Left    left breast cancer   BREAST SURGERY Left 2010   LEFT BREAST WIDE EXCISION   COLONOSCOPY  2012, 12/16/2015   [place]  COLONOSCOPY WITH PROPOFOL  N/A 12/16/2015   Procedure: COLONOSCOPY WITH PROPOFOL ;  Surgeon: Lamar ONEIDA Holmes, MD;  Location[facility] ENDOSCOPY;  Service: Endoscopy;  Laterality: N/A;   COLONOSCOPY WITH PROPOFOL  N/A 12/19/2021   Procedure: COLONOSCOPY WITH PROPOFOL ;  Surgeon: Maryruth Ole ONEIDA, MD;  Location: [facility] ENDOSCOPY;  Service: Endoscopy;  Laterality: N/A;   DILATATION & CURETTAGE/HYSTEROSCOPY WITH MYOSURE N/A 07/01/2015   Procedure: DILATATION & CURETTAGE/HYSTEROSCOPY WITH MYOSURE;  Surgeon: Debby JINNY Dinsmore, MD;  Location: [facility] ORS;  Service: Gynecology;  Laterality: N/A;   DILATION AND CURETTAGE OF UTERUS     ESOPHAGOGASTRODUODENOSCOPY (EGD) WITH PROPOFOL  N/A 12/19/2021   Procedure: ESOPHAGOGASTRODUODENOSCOPY (EGD) WITH PROPOFOL ;  Surgeon: Maryruth Ole ONEIDA, MD;  Location: [facility] ENDOSCOPY;  Service:  Endoscopy;  Laterality: N/A;   EYE SURGERY     FOR GLAUCOMA   PHOTOCOAGULATION Left 03/19/2016   Procedure: PHOTOCOAGULATION;  Surgeon: Donzell Arlyce Budd, MD;  Location: [facility];  Service: Ophthalmology;  Laterality: Left;  Diabetic - oral meds CANNOT arrive before 8AM IVA Block   SENTINEL LYMPH NODE BIOPSY Left 2010   TOE SURGERY      Home Medications:  Allergies as of 01/06/2024       Reactions   Brimonidine    Burning sensation in eyes, redness   Dorzolamide    Burning sensation in eyes, redness   Flagyl [metronidazole]    Doesn't remember reaction   Lisinopril Cough   Naprosyn [naproxen]    UNKNOWN   Tegaderm Ag Mesh [silver]    Skin irritation   Doxycycline Other (See Comments)   fatigue per pt   Latex Rash   Allergic to surgical tape   Tape Rash   Skin irritation. PAPER OK TO USE PAPER OK TO USE  Other reaction(s): RASH  Skin irritation. PAPER OK TO USE        Medication List        Accurate as of January 06, 2024 11:13 AM. If you have any questions, ask your nurse or doctor.          acetaminophen  500 MG tablet Commonly known as: TYLENOL  Take 500 mg by mouth every 6 (six) hours as needed.   aspirin 81 MG tablet Take 81 mg by mouth daily.   Calcium 500/Vitamin D 500-3.125 MG-MCG Tabs Generic drug: Calcium Carb-Cholecalciferol Take 1 tablet by mouth daily.   Crestor 10 MG tablet Generic drug: rosuvastatin Take 5 mg by mouth daily. 1/2 of 10 mg tablet daily   estradiol 0.1 MG/GM vaginal cream Commonly known as: ESTRACE Apply one pea-sized amount around the opening of the urethra daily for 30 days, then 3 times weekly moving forward. Started by: [name]   fexofenadine 180 MG tablet Commonly known as: ALLEGRA Take 180 mg by mouth daily.   fish oil-omega-3 fatty acids 1000 MG capsule Take 2 g by mouth daily.   fluticasone 50 MCG/ACT nasal spray Commonly known as: FLONASE Place 2 sprays into both nostrils daily. As  needed   glipiZIDE 5 MG tablet Commonly known as: GLUCOTROL Take 5 mg by mouth 2 (two) times daily before a meal.   Iron  325 (65 Fe) MG Tabs Take 1 tablet by mouth daily.   latanoprost 0.005 % ophthalmic solution Commonly known as: XALATAN Place 1 drop into both eyes at bedtime.   Linzess 72 MCG capsule Generic drug: linaclotide Take 72 mcg by mouth daily.   metFORMIN 1000 MG tablet Commonly known as: GLUCOPHAGE Take 1,000 mg by mouth 2 (two) times daily.  multivitamin capsule Take 1 capsule by mouth daily.   Ozempic (1 MG/DOSE) 4 MG/3ML Sopn Generic drug: Semaglutide (1 MG/DOSE) Inject 1 mg into the skin once a week.   pantoprazole 40 MG tablet Commonly known as: PROTONIX Take 40 mg by mouth daily.   sitaGLIPtin-metformin 50-1000 MG tablet Commonly known as: JANUMET Take 1 tablet by mouth 2 (two) times daily with a meal.   solifenacin 10 MG tablet Commonly known as: VESICARE Take 1 tablet (10 mg total) by mouth daily. What changed:  medication strength how much to take Changed by: Rishith Siddoway   timolol 0.5 % ophthalmic solution Commonly known as: TIMOPTIC Place 1 drop into the left eye 2 (two) times daily.        Allergies:  Allergies  Allergen Reactions   Brimonidine     Burning sensation in eyes, redness   Dorzolamide     Burning sensation in eyes, redness   Flagyl [Metronidazole]     Doesn't remember reaction   Lisinopril Cough   Naprosyn [Naproxen]     UNKNOWN    Tegaderm Ag Mesh [Silver]     Skin irritation    Doxycycline Other (See Comments)    fatigue per pt   Latex Rash    Allergic to surgical tape   Tape Rash    Skin irritation. PAPER OK TO USE  PAPER OK TO USE  Other reaction(s): RASH  Skin irritation. PAPER OK TO USE    Family History: Family History  Problem Relation Age of Onset   Diabetes Mother    Diabetes Father    Arthritis Sister    Diabetes Sister    Breast cancer Sister 68   Breast cancer Sister 59    Diabetes Brother    Breast cancer Other     Social History:  reports that she has never smoked. She has never used smokeless tobacco. She reports that she does not drink alcohol and does not use drugs.  ROS: Pertinent ROS in HPI  Physical Exam: BP (!) 145/73 (BP Location: Right Arm, Patient Position: Sitting, Cuff Size: Large)   Pulse 84   Wt 213 lb (96.6 kg)   SpO2 98%   BMI 38.96 kg/m   Constitutional:  Well nourished. Alert and oriented, No acute distress. HEENT: [place] AT, moist mucus membranes.  Trachea midline Cardiovascular: No clubbing, cyanosis, or edema. Respiratory: Normal respiratory effort, no increased work of breathing. GU: No CVA tenderness.  No bladder fullness or masses.  Recession of labia minora, dry, pale vulvar vaginal mucosa and loss of mucosal ridges and folds.  Normal urethral meatus, no lesions, no prolapse, no discharge.   Urethral caruncle noted.  No bladder fullness, tenderness or masses. Pale vagina mucosa, poor estrogen effect, no discharge, no lesions, poor pelvic support, garde I cystocele and no rectocele noted.  Anus and perineum are without rashes or lesions.    Neurologic: Grossly intact, no focal deficits, moving all 4 extremities. Psychiatric: Normal mood and affect.    Laboratory Data: See EPIC and HPI I have reviewed the labs.   Pertinent Imaging:  01/06/24 11:05  Scan Result 3mL    Assessment & Plan:    1. UTI - UA w/ micro heme  - urine culture pending, but patient's symptoms do not indicate a UTI - She is already taking amoxicillin for URI - microscopic hematuria likely to vaginal atrophy   2. OAB - Vesicare 5 mg daily is becoming less effective - increase Vesicare 10 mg daily  3. Urge incontinence - Explained how her symptoms may be due to pelvic floor weakness and encouraged the use of vaginal estrogen cream, Kegel exercises and increasing the Vesicare to 10 mg daily  4.  Vaginal atrophy/urethral caruncle - We will start  vaginal estrogen cream, applying every night for 30 days and then continuing on to Monday, Wednesday, and Friday evenings - Explained that this will be a long-term medication and that will take at least 8 weeks to notice the physiological changes that we will need in the vagina in order to address her symptoms  Return in about 3 months (around 04/07/2024) for UA, OAB questionnaire, PVR .  These notes generated with voice recognition software. I apologize for typographical errors.  CLOTILDA HELON RIGGERS  [facility] [address]  [address] [place], [place] [address] [phone]

## 2024-01-05 DIAGNOSIS — B9689 Other specified bacterial agents as the cause of diseases classified elsewhere: Secondary | ICD-10-CM | POA: Diagnosis not present

## 2024-01-05 DIAGNOSIS — J019 Acute sinusitis, unspecified: Secondary | ICD-10-CM | POA: Diagnosis not present

## 2024-01-05 DIAGNOSIS — J069 Acute upper respiratory infection, unspecified: Secondary | ICD-10-CM | POA: Diagnosis not present

## 2024-01-05 NOTE — Progress Notes (Signed)
 History of Present Illness:   Selena Contreras is a 78 y.o. female here for URI symptoms over the last week.  She reports cough, postnasal drainage, and mild production of cough.  She denies any fever or chills.  She denies any body aches.  She tried over-the-counter Robitussin.  Cough is worse at night.  She feels a tickle in her throat.  However, that has gotten better.  She denies any frontal headache or maxillary sinus pressure.  She denies any sick contacts.  The cough is intermittently productive.  She is otherwise eating and drinking well.  She does have seasonal allergies, uses Flonase and Zyrtec daily.  She has a history of sinus infections.   Past Medical History:   Past Medical History:  Diagnosis Date  . Allergy   . Anemia 05/27/2012  . Anesthesia complication    Patient requires higher dosages of anesthesia than others.  . Arthritis   . Awareness under anesthesia   . Back pain 06/20/2012  . Breast cancer (CMS/HHS-HCC) 05/26/2008   1.7 cm, pT1c, N0, ER/PR negativeHer 2 neu not ampified.  Chemo/ radiation.  . Breast cancer in situ 2010   left breast, lumpectomy with chemo/radiation  . Cataracts, bilateral 04/09/2012  . CKD (chronic kidney disease) stage 3, GFR 30-59 ml/min (CMS-HCC) 04/24/2023  . Diabetes mellitus type 2, uncomplicated (CMS/HHS-HCC)   . GERD (gastroesophageal reflux disease)   . Glaucoma   . Morbid obesity with BMI of 40.0-44.9, adult (CMS-HCC)   . Peripheral neuropathy     Past Surgical History:   Past Surgical History:  Procedure Laterality Date  . MASTECTOMY PARTIAL / LUMPECTOMY Left 2010  . GLAUCOMA EYE SURGERY Left 06/12/2013   SLT (Dr. Evertt)   . BREAST EXCISIONAL BIOPSY Right 2017  . Fx D&C and Myosure  07/01/2015  . PERCUTANEOUS BIOPSY BREAST W/NEEDLE LOCALIZATION Right 08/2015  . coon  12/16/2015   FH Colon Polyps (Sister): [facility] 12/2020  . CATARACT EXTRACTION  09/2018   Left eye  . EXTRACTION CATARACT EXTRACAPSULAR W/INSERTION  INTRAOCULAR PROSTHESIS Left 10/06/2018   Procedure: Combined phaco/IOL/trab Express left eye;  Surgeon: Evertt Meribeth ORN, MD;  [facility];  Service: Ophthalmology;  Laterality: Left;  Pt would like surgery before noon  . INSERTION AQUEOUS SHUNT Left 10/06/2018   Procedure: Combined phaco/IOL/trab Express left eye;  Surgeon: Evertt Meribeth ORN, MD;  [facility];  Service: Ophthalmology;  Laterality: Left;  . removal tympanicum tumor Right 06/21/2021  . Colon @ [facility]  12/19/2021   Normal examined colon/FHx CP/No repeat due to age/CTL  . EGD @ [facility]  12/19/2021   Gastritis/Intestinal metaplasia/No repeat/CTL  . EXTRACTION CATARACT EXTRACAPSULAR W/INSERTION INTRAOCULAR PROSTHESIS Right 04/23/2022   Procedure: RIGHT Phaco/IOL/[place] Dual Blade Goniotomy with Dextenza  Right eye;  Surgeon: Evertt Meribeth ORN, MD;  Location: [facility];  Service: Ophthalmology;  Laterality: Right;  . INSJ RX ELUTING IMPLT PUNCTAL DILAT LAC CANAL EA  Right 04/23/2022   Procedure: Insertion of drug-eluting implant, including punctal dilation when performed, into lacrimal canaliculus, each;  Surgeon: Evertt Meribeth ORN, MD;  Location: [facility];  Service: Ophthalmology;  Laterality: Right;  . TRABECULOTOMY AB EXTERNO Right 04/23/2022   Procedure: RIGHT Phaco/IOL/[place] Dual Blade Goniotomy  with Dextenza  Right eye;  Surgeon: Evertt Meribeth ORN, MD;  Location: [facility];  Service: Ophthalmology;  Laterality: Right;  . ARTHROPLASTY TOTAL KNEE Left 04/24/2023   Procedure: ARTHROPLASTY, KNEE, CONDYLE AND PLATEAU; MEDIAL AND LATERAL COMPARTMENTSWITH OR WITHOUT PATELLA RESURFACING (TOTAL KNEE ARTHROPLASTY);  Surgeon: Bernardino Alyce Dover, MD;  Location: [facility] OR;  Service: Orthopedics;  Laterality: Left;  . COLONOSCOPY  10/2010 [facility])   FH Colon Polyps (Sister)  . ear surgery Right    in 05/2021, pt states a tumor was removed from around the eardrum  . toe surgery      Allergies:   Allergies  Allergen  Reactions  . Flagyl [Metronidazole Hcl] Hives  . Adhesive Tape-Silicones Rash    PAPER OK TO USE  . Brimonidine Other (See Comments)    Burning sensation in eyes, redness  . Dorzolamide Other (See Comments)    Burning sensation in eyes, redness  . Doxycycline Other (See Comments)    fatigue per pt  . Latex, Natural Rubber Rash    Allergic to surgical tape  . Lisinopril Other (See Comments)    Other Reaction: OTHER REACTION: COUGH  . Metronidazole Other (See Comments)  . Naprosyn [Naproxen] Unknown    UNKNOWN    Current Medications:   Prior to Admission medications  Medication Sig Taking? Last Dose  alcohol swabs (BD ALCOHOL SWABS) PadM Use 1 each as directed (E11.40) Yes Taking  amoxicillin (AMOXIL) 500 MG capsule Take 4 capsules (2000 mg)by mouth one hour prior to dental cleanings or procedures Yes PRN Not Currently Taking  blood glucose control, normal (ACCU-CHEK SMARTVIEW CONTRL SOL) Soln Use as directed with glucose meter Yes Taking  calcium-vitamin D 500-125 mg-unit tablet Take 1 tablet by mouth once daily    Yes Taking  dorzolamide-timoloL (COSOPT) 22.3-6.8 mg/mL ophthalmic solution Place 1 drop into both eyes 2 (two) times daily Yes Taking  ferrous sulfate 325 (65 FE) MG tablet Take 325 mg by mouth daily with breakfast. Yes Taking  fexofenadine (ALLEGRA) 180 MG tablet Take 180 mg by mouth once daily Yes Taking  fluticasone propionate (FLONASE) 50 mcg/actuation nasal spray USE 2 SPRAYS IN EACH NOSTRIL EVERY DAY Yes Taking  glipiZIDE (GLUCOTROL) 5 MG tablet TAKE 1 TABLET TWICE DAILY BEFORE MEALS Yes Taking  latanoprost (XALATAN) 0.005 % ophthalmic solution Place 1 drop into the left eye at bedtime Yes Taking  metFORMIN (GLUCOPHAGE) 1000 MG tablet TAKE 1 TABLET TWICE DAILY WITH MEALS Patient taking differently: Take 1,000 mg by mouth once daily Yes Taking  multivitamin with minerals tablet Take 1 tablet by mouth once daily.  Yes Taking  OMEGA-3/DHA/EPA/FISH OIL (FISH  OIL-OMEGA-3 FATTY ACIDS) 300-1,000 mg capsule Take 2 g by mouth at bedtime Yes Taking  pantoprazole (PROTONIX) 40 MG DR tablet Take 1 tablet (40 mg total) by mouth once daily Take 15-20 mins before meal Yes Taking  rosuvastatin (CRESTOR) 5 MG tablet TAKE 1 TABLET ONE TIME DAILY Yes Taking  semaglutide (OZEMPIC) 2 mg/dose (8 mg/3 mL) pen injector INJECT 2MG  UNDER THE SKIN ONE TIME WEEKLY Yes Taking  solifenacin (VESICARE) 5 MG tablet TAKE 1 TABLET ONE TIME DAILY Yes Taking  TRUE METRIX GLUCOSE TEST STRIP test strip TEST BLOOD SUGAR TWO TIMES DAILY AS INSTRUCTED Yes Taking  TRUEPLUS LANCETS USE AS INSTRUCTED TWO TIMES DAILY Yes Taking  amoxicillin (AMOXIL) 875 MG tablet Take 1 tablet (875 mg total) by mouth 2 (two) times daily for 10 days    benzonatate (TESSALON) 200 MG capsule Take 1 capsule (200 mg total) by mouth 3 (three) times daily as needed for Cough    blood glucose meter kit as directed    FUROsemide (LASIX) 20 MG tablet Take 1 tablet (20 mg total) by mouth once daily Patient not taking: Reported on 07/22/2023  promethazine-dextromethorphan (PROMETHAZINE-DM) 6.25-15 mg/5 mL syrup Take 5 mLs by mouth at bedtime      Family History:   Family History  Problem Relation Name Age of Onset  . Diabetes type II Mother [place] Monty Mayans   . Glaucoma Mother [place] Monty Mayans   . Dementia Mother [place] Monty Mayans   . Alzheimer's disease Mother [place] Monty Mayans   . High blood pressure (Hypertension) Father Elsie Victory Mayans   . Diabetes type II Father Elsie Victory Mayans   . COPD Sister Angela   . Osteoporosis (Thinning of bones) Sister Angela   . Asthma Sister Angela   . Obesity Sister Angela   . Seizures Sister Angela   . Macular degeneration Sister Angela   . Breast cancer Sister Angela        Also Lula's daughter ETTER Abe had breast cancer )  . Asthma Sister Blima   . Breast cancer Sister Blima   . Osteoarthritis Sister Blima   . Obesity Sister Blima   . Glaucoma Sister Blima    . Macular degeneration Sister Blima   . Fibromyalgia Sister Erminio   . Obesity Sister Erminio   . Osteoarthritis Sister Erminio   . Glaucoma Sister Erminio   . Asthma Sister Erminio   . Rheum arthritis Sister Jerelene Budge   . Obesity Sister Jerelene Budge        Had weight loss surgery  . Diabetes type II Sister Jerelene Budge        Diabetic DAVINA  . Osteoarthritis Brother Bill   . Osteoarthritis Brother Elza   . Cancer Brother James        throat  . Diabetes type II Brother James   . Breast cancer Brother Lynwood        Died with throat cancer  . PONV Son Porfirio Seeds   . Anesthesia problems Son Jerrell Dewald        slow emergence  . Allergic rhinitis Son Porfirio Seeds   . High blood pressure (Hypertension) Son Porfirio Seeds   . Allergic rhinitis Son Kanyia Heaslip   . Diabetes type II Son Lani Harmon Reamy        Diabetic  . High blood pressure (Hypertension) Son Lani Harmon [place]   . Blindness Neg Hx    . Malignant hypertension Neg Hx      Social History:   Social History   Socioeconomic History  . Marital status: Widowed  . Number of children: 2  Occupational History  . Occupation: retired  Tobacco Use  . Smoking status: Never    Passive exposure: Never  . Smokeless tobacco: Never  Vaping Use  . Vaping status: Never Used  Substance and Sexual Activity  . Alcohol use: No    Alcohol/week: 0.0 standard drinks of alcohol  . Drug use: No  . Sexual activity: Yes    Partners: Male    Birth control/protection: Post-menopausal  Social History Narrative   Widowed in in 1981.  Two children.  Five grandkids.  Job: retired from [employer], Engineer, structural, Agricultural consultant.  Exercise: difficult due to neuropathy, walks some.  Wears seatbelt, avoids sun.  Dentist: yes.  Calcium in diet: yes.  Religious beliefs affecting health care: none.      Advanced Directives: will talk with family.  Gave forms 11/18/13.   Social Drivers of Corporate investment banker Strain: Low  Risk  (07/31/2023)   Overall Financial Resource Strain (CARDIA)   . Difficulty  of Paying Living Expenses: Not hard at all  Food Insecurity: No Food Insecurity (07/31/2023)   Hunger Vital Sign   . Worried About Programme researcher, broadcasting/film/video in the Last Year: Never true   . Ran Out of Food in the Last Year: Never true  Transportation Needs: No Transportation Needs (07/31/2023)   PRAPARE - Transportation   . Lack of Transportation (Medical): No   . Lack of Transportation (Non-Medical): No  Physical Activity: Inactive (10/02/2018)   Exercise Vital Sign   . Days of Exercise per Week: 0 days   . Minutes of Exercise per Session: 0 min  Stress: No Stress Concern Present (10/02/2018)   [employer] of Occupational Health - Occupational Stress Questionnaire   . Feeling of Stress : Not at all  Social Connections: Unknown (10/02/2018)   Social Connection and Isolation Panel   . Frequency of Communication with Friends and Family: Patient declined   . Frequency of Social Gatherings with Friends and Family: Patient declined   . Attends Religious Services: Patient declined   . Active Member of Clubs or Organizations: Patient declined   . Attends Banker Meetings: Patient declined   . Marital Status: Patient declined  Housing Stability: Low Risk  (07/31/2023)   Housing Stability Vital Sign   . Unable to Pay for Housing in the Last Year: No   . Number of Times Moved in the Last Year: 0   . Homeless in the Last Year: No    Review of Systems:   Review of Systems  Constitutional:  Negative for chills and fever.  HENT:  Positive for congestion, postnasal drip and rhinorrhea. Negative for ear pain and sore throat.   Eyes:  Negative for pain.  Respiratory:  Positive for cough. Negative for shortness of breath and wheezing.   Gastrointestinal:  Negative for abdominal pain.  Skin:  Negative for rash.  Allergic/Immunologic: Positive for environmental allergies.  Neurological:  Negative for dizziness,  light-headedness and headaches.  All other systems reviewed and are negative.   Vitals:   Vitals:   01/05/24 1306  BP: 116/67  Pulse: 84  Temp: 36.8 C (98.2 F)  SpO2: 97%  Weight: 98.9 kg (218 lb)  Height: 157.5 cm (5' 2)     Body mass index is 39.87 kg/m.  Physical Exam:   Physical Exam Vitals and nursing note reviewed.  Constitutional:      General: She is not in acute distress.    Appearance: She is well-developed. She is not diaphoretic.  HENT:     Head: Normocephalic and atraumatic.     Right Ear: Hearing, tympanic membrane, ear canal and external ear normal.     Left Ear: Hearing, tympanic membrane, ear canal and external ear normal.     Nose: Congestion present.     Mouth/Throat:     Pharynx: Oropharynx is clear. Uvula midline. No oropharyngeal exudate or posterior oropharyngeal erythema.     Tonsils: No tonsillar abscesses.  Eyes:     General: No scleral icterus.       Right eye: No discharge.        Left eye: No discharge.     Conjunctiva/sclera: Conjunctivae normal.     Pupils: Pupils are equal, round, and reactive to light.  Cardiovascular:     Rate and Rhythm: Normal rate and regular rhythm.     Heart sounds: No murmur heard.    No friction rub. No gallop.  Pulmonary:     Effort: Pulmonary effort  is normal. No respiratory distress.     Breath sounds: Normal breath sounds. No wheezing.  Chest:     Chest wall: No tenderness.  Abdominal:     Comments:    Musculoskeletal:        General: Normal range of motion.     Cervical back: Normal range of motion and neck supple.  Lymphadenopathy:     Cervical: No cervical adenopathy.  Skin:    General: Skin is warm and dry.  Neurological:     Mental Status: She is alert and oriented to person, place, and time.  Psychiatric:        Behavior: Behavior normal.        Thought Content: Thought content normal.        Judgment: Judgment normal.     Assessment and Plan:  No results found for this visit on  01/05/24.  Diagnoses and all orders for this visit:  Acute bacterial sinusitis  Acute upper respiratory infection  Other orders -     amoxicillin (AMOXIL) 875 MG tablet; Take 1 tablet (875 mg total) by mouth 2 (two) times daily for 10 days -     promethazine-dextromethorphan (PROMETHAZINE-DM) 6.25-15 mg/5 mL syrup; Take 5 mLs by mouth at bedtime -     benzonatate (TESSALON) 200 MG capsule; Take 1 capsule (200 mg total) by mouth 3 (three) times daily as needed for Cough    Patient Instructions  NEGATIVE for COVID-19, RSV, and flu. Continue same plan. Exercise proper hygiene, and frequent handwashing.  Take medications as directed. Return to care should your symptoms not improve, or please present to the nearest Emergency Department should your symptoms change or worsen in any way.    Portions of this note were created using dictation software and may contain typographical errors.   Patient received an After Visit Summary

## 2024-01-06 ENCOUNTER — Other Ambulatory Visit: Payer: Self-pay

## 2024-01-06 ENCOUNTER — Other Ambulatory Visit
Admission: RE | Admit: 2024-01-06 | Discharge: 2024-01-06 | Disposition: A | Discharge status: Home / Self Care | Attending: Urology | Admitting: Urology

## 2024-01-06 ENCOUNTER — Ambulatory Visit: Admitting: Urology

## 2024-01-06 ENCOUNTER — Encounter: Payer: Self-pay | Admitting: Urology

## 2024-01-06 VITALS — BP 145/73 | HR 84 | Wt 213.0 lb

## 2024-01-06 DIAGNOSIS — N3281 Overactive bladder: Secondary | ICD-10-CM

## 2024-01-06 DIAGNOSIS — N952 Postmenopausal atrophic vaginitis: Secondary | ICD-10-CM | POA: Diagnosis not present

## 2024-01-06 DIAGNOSIS — N3941 Urge incontinence: Secondary | ICD-10-CM

## 2024-01-06 DIAGNOSIS — R3989 Other symptoms and signs involving the genitourinary system: Secondary | ICD-10-CM | POA: Diagnosis not present

## 2024-01-06 DIAGNOSIS — R399 Unspecified symptoms and signs involving the genitourinary system: Secondary | ICD-10-CM

## 2024-01-06 LAB — URINALYSIS, COMPLETE (UACMP) WITH MICROSCOPIC
Bilirubin Urine: NEGATIVE
Glucose, UA: NEGATIVE mg/dL
Ketones, ur: NEGATIVE mg/dL
Leukocytes,Ua: NEGATIVE
Nitrite: NEGATIVE
Protein, ur: NEGATIVE mg/dL
Specific Gravity, Urine: 1.025 (ref 1.005–1.030)
pH: 5.5 (ref 5.0–8.0)

## 2024-01-06 LAB — BLADDER SCAN AMB NON-IMAGING

## 2024-01-06 MED ORDER — SOLIFENACIN SUCCINATE 10 MG PO TABS: 10.0000 mg | ORAL_TABLET | Freq: Every day | ORAL | 3 refills | Status: AC

## 2024-01-06 MED ORDER — ESTRADIOL 0.1 MG/GM VA CREA: TOPICAL_CREAM | VAGINAL | 12 refills | Status: AC

## 2024-01-07 ENCOUNTER — Ambulatory Visit: Payer: Self-pay | Admitting: Urology

## 2024-01-07 LAB — URINE CULTURE: Culture: NO GROWTH

## 2024-01-23 DIAGNOSIS — I10 Essential (primary) hypertension: Secondary | ICD-10-CM | POA: Diagnosis not present

## 2024-01-23 DIAGNOSIS — E1140 Type 2 diabetes mellitus with diabetic neuropathy, unspecified: Secondary | ICD-10-CM | POA: Diagnosis not present

## 2024-01-23 DIAGNOSIS — N1832 Chronic kidney disease, stage 3b: Secondary | ICD-10-CM | POA: Diagnosis not present

## 2024-01-23 DIAGNOSIS — E785 Hyperlipidemia, unspecified: Secondary | ICD-10-CM | POA: Diagnosis not present

## 2024-01-30 ENCOUNTER — Other Ambulatory Visit: Payer: Self-pay | Admitting: Physician Assistant

## 2024-01-30 DIAGNOSIS — N1832 Chronic kidney disease, stage 3b: Secondary | ICD-10-CM | POA: Diagnosis not present

## 2024-01-30 DIAGNOSIS — D631 Anemia in chronic kidney disease: Secondary | ICD-10-CM | POA: Diagnosis not present

## 2024-01-30 DIAGNOSIS — N644 Mastodynia: Secondary | ICD-10-CM

## 2024-01-30 DIAGNOSIS — E785 Hyperlipidemia, unspecified: Secondary | ICD-10-CM | POA: Diagnosis not present

## 2024-01-30 DIAGNOSIS — Z1231 Encounter for screening mammogram for malignant neoplasm of breast: Secondary | ICD-10-CM | POA: Diagnosis not present

## 2024-01-30 DIAGNOSIS — E1122 Type 2 diabetes mellitus with diabetic chronic kidney disease: Secondary | ICD-10-CM | POA: Diagnosis not present

## 2024-01-30 DIAGNOSIS — C50812 Malignant neoplasm of overlapping sites of left female breast: Secondary | ICD-10-CM

## 2024-01-30 DIAGNOSIS — I129 Hypertensive chronic kidney disease with stage 1 through stage 4 chronic kidney disease, or unspecified chronic kidney disease: Secondary | ICD-10-CM | POA: Diagnosis not present

## 2024-01-30 DIAGNOSIS — E1140 Type 2 diabetes mellitus with diabetic neuropathy, unspecified: Secondary | ICD-10-CM | POA: Diagnosis not present

## 2024-01-30 DIAGNOSIS — Z1331 Encounter for screening for depression: Secondary | ICD-10-CM | POA: Diagnosis not present

## 2024-01-30 DIAGNOSIS — Z0000 Encounter for general adult medical examination without abnormal findings: Secondary | ICD-10-CM | POA: Diagnosis not present

## 2024-02-06 ENCOUNTER — Ambulatory Visit
Admission: RE | Admit: 2024-02-06 | Discharge: 2024-02-06 | Disposition: A | Discharge status: Home / Self Care | Source: Ambulatory Visit | Attending: Physician Assistant | Admitting: Physician Assistant

## 2024-02-06 ENCOUNTER — Inpatient Hospital Stay
Admission: RE | Admit: 2024-02-06 | Discharge: 2024-02-06 | Attending: Physician Assistant | Admitting: Physician Assistant

## 2024-02-06 DIAGNOSIS — C50812 Malignant neoplasm of overlapping sites of left female breast: Secondary | ICD-10-CM | POA: Diagnosis not present

## 2024-02-06 DIAGNOSIS — Z171 Estrogen receptor negative status [ER-]: Secondary | ICD-10-CM | POA: Insufficient documentation

## 2024-02-06 DIAGNOSIS — N644 Mastodynia: Secondary | ICD-10-CM | POA: Diagnosis not present

## 2024-02-20 ENCOUNTER — Encounter: Payer: Self-pay | Admitting: Urology

## 2024-04-13 ENCOUNTER — Ambulatory Visit: Admitting: Urology
